# Patient Record
Sex: Male | Born: 1955 | Race: White | Hispanic: No | Marital: Married | Smoking: Never smoker
Health system: Southern US, Community
[De-identification: ages and names within clinical notes are randomized; demographics above are authoritative.]

## PROBLEM LIST (undated history)

## (undated) DIAGNOSIS — Z8249 Family history of ischemic heart disease and other diseases of the circulatory system: Secondary | ICD-10-CM

## (undated) DIAGNOSIS — I499 Cardiac arrhythmia, unspecified: Secondary | ICD-10-CM

## (undated) DIAGNOSIS — I10 Essential (primary) hypertension: Secondary | ICD-10-CM

## (undated) DIAGNOSIS — I480 Paroxysmal atrial fibrillation: Secondary | ICD-10-CM

## (undated) DIAGNOSIS — E785 Hyperlipidemia, unspecified: Secondary | ICD-10-CM

## (undated) DIAGNOSIS — R7303 Prediabetes: Secondary | ICD-10-CM

## (undated) HISTORY — DX: Cardiac arrhythmia, unspecified: I49.9

## (undated) HISTORY — DX: Prediabetes: R73.03

## (undated) HISTORY — PX: EYE SURGERY: SHX253

## (undated) HISTORY — DX: Essential (primary) hypertension: I10

## (undated) HISTORY — DX: Paroxysmal atrial fibrillation: I48.0

## (undated) HISTORY — DX: Family history of ischemic heart disease and other diseases of the circulatory system: Z82.49

## (undated) HISTORY — DX: Hyperlipidemia, unspecified: E78.5

---

## 2003-05-15 ENCOUNTER — Ambulatory Visit (HOSPITAL_COMMUNITY): Admission: RE | Admit: 2003-05-15 | Discharge: 2003-05-15 | Payer: Self-pay | Admitting: Oral Surgery

## 2003-05-15 ENCOUNTER — Encounter (INDEPENDENT_AMBULATORY_CARE_PROVIDER_SITE_OTHER): Payer: Self-pay | Admitting: Specialist

## 2009-10-14 ENCOUNTER — Encounter (INDEPENDENT_AMBULATORY_CARE_PROVIDER_SITE_OTHER): Payer: Self-pay | Admitting: *Deleted

## 2009-10-15 ENCOUNTER — Encounter (INDEPENDENT_AMBULATORY_CARE_PROVIDER_SITE_OTHER): Payer: Self-pay | Admitting: *Deleted

## 2009-10-16 ENCOUNTER — Ambulatory Visit: Payer: Self-pay | Admitting: Gastroenterology

## 2009-10-22 ENCOUNTER — Ambulatory Visit: Payer: Self-pay | Admitting: Gastroenterology

## 2010-08-27 NOTE — Miscellaneous (Signed)
Summary: screening colon age--ch.  Clinical Lists Changes  Medications: Added new medication of MOVIPREP 100 GM  SOLR (PEG-KCL-NACL-NASULF-NA ASC-C) As directed - Signed Rx of MOVIPREP 100 GM  SOLR (PEG-KCL-NACL-NASULF-NA ASC-C) As directed;  #1 x 0;  Signed;  Entered by: Clide Cliff RN;  Authorized by: Rachael Fee MD;  Method used: Electronically to [facility]  [address] [id number] [address], [place], [place]  [address], Ph: [phone] or [phone], Fax: [phone] Observations: Added new observation of NKA: T (10/16/2009 10:13)    Prescriptions: MOVIPREP 100 GM  SOLR (PEG-KCL-NACL-NASULF-NA ASC-C) As directed  #1 x 0   Entered by:   Clide Cliff RN   Authorized by:   Rachael Fee MD   Signed by:   Clide Cliff RN on 10/16/2009   Method used:   Electronically to        [facility]  [address] [id number] ([facility]       [address]       [place], [place]  [address]       Ph: [phone] or [phone]       Fax: [phone]   RxID:   [id number]

## 2010-08-27 NOTE — Letter (Signed)
Summary: [facility] Instructions  [facility]  [address] [place], [place] [address]   Phone: [phone]  Fax: [phone]       LORANCE PICKERAL    10-13-1955    MRN: [id number]        Procedure Day Dorna Bloom:  Wednesday 10/22/2009     Arrival Time: 8:00 am      Procedure Time: 9:00 am     Location of Procedure:                    _ x_   Endoscopy Center (4th Floor)                        PREPARATION FOR COLONOSCOPY WITH MOVIPREP   Starting 5 days prior to your procedure Friday 3/25 do not eat nuts, seeds, popcorn, corn, beans, peas,  salads, or any raw vegetables.  Do not take any fiber supplements (e.g. Metamucil, Citrucel, and Benefiber).  THE DAY BEFORE YOUR PROCEDURE         DATE: Tuesday 3/29  1.  Drink clear liquids the entire day-NO SOLID FOOD  2.  Do not drink anything colored red or purple.  Avoid juices with pulp.  No orange juice.  3.  Drink at least 64 oz. (8 glasses) of fluid/clear liquids during the day to prevent dehydration and help the prep work efficiently.  CLEAR LIQUIDS INCLUDE: Water Jello Ice Popsicles Tea (sugar ok, no milk/cream) Powdered fruit flavored drinks Coffee (sugar ok, no milk/cream) Gatorade Juice: apple, white grape, white cranberry  Lemonade Clear bullion, consomm, broth Carbonated beverages (any kind) Strained chicken noodle soup Hard Candy                             4.  In the morning, mix first dose of MoviPrep solution:    Empty 1 Pouch A and 1 Pouch B into the disposable container    Add lukewarm drinking water to the top line of the container. Mix to dissolve    Refrigerate (mixed solution should be used within 24 hrs)  5.  Begin drinking the prep at 5:00 p.m. The MoviPrep container is divided by 4 marks.   Every 15 minutes drink the solution down to the next mark (approximately 8 oz) until the full liter is complete.   6.  Follow completed prep with 16 oz of clear liquid of your choice (Nothing  red or purple).  Continue to drink clear liquids until bedtime.  7.  Before going to bed, mix second dose of MoviPrep solution:    Empty 1 Pouch A and 1 Pouch B into the disposable container    Add lukewarm drinking water to the top line of the container. Mix to dissolve    Refrigerate  THE DAY OF YOUR PROCEDURE      DATE: Wednesday 3/30  Beginning at 4:00 a.m. (5 hours before procedure):         1. Every 15 minutes, drink the solution down to the next mark (approx 8 oz) until the full liter is complete.  2. Follow completed prep with 16 oz. of clear liquid of your choice.    3. You may drink clear liquids until 7:00 am (2 HOURS BEFORE PROCEDURE).   MEDICATION INSTRUCTIONS  Unless otherwise instructed, you should take regular prescription medications with a small sip of water   as early as possible the morning of  your procedure.         OTHER INSTRUCTIONS  You will need a responsible adult at least 57 years of age to accompany you and drive you home.   This person must remain in the waiting room during your procedure.  Wear loose fitting clothing that is easily removed.  Leave jewelry and other valuables at home.  However, you may wish to bring a book to read or  an iPod/MP3 player to listen to music as you wait for your procedure to start.  Remove all body piercing jewelry and leave at home.  Total time from sign-in until discharge is approximately 2-3 hours.  You should go home directly after your procedure and rest.  You can resume normal activities the  day after your procedure.  The day of your procedure you should not:   Drive   Make legal decisions   Operate machinery   Drink alcohol   Return to work  You will receive specific instructions about eating, activities and medications before you leave.    The above instructions have been reviewed and explained to me by   Clide Cliff, RN_____________________    I fully understand and can  verbalize these instructions _____________________________ Date _________

## 2010-08-27 NOTE — Procedures (Signed)
Summary: Colonoscopy  Patient: [name] Note: All result statuses are Final unless otherwise noted.  Tests: (1) Colonoscopy (COL)   COL Colonoscopy           DONE     [facility]     [address]     [place], [place]  [address]           COLONOSCOPY PROCEDURE REPORT           PATIENT:  Frank Hunt, Frank Hunt  MR#:  [id number]     BIRTHDATE:  [birth year], 54 yrs. old  GENDER:  male     ENDOSCOPIST:  Rachael Fee, MD     REF. BY:  [name], M.D.     PROCEDURE DATE:  10/22/2009     PROCEDURE:  Diagnostic Colonoscopy     ASA CLASS:  Class II     INDICATIONS:  Routine Risk Screening     MEDICATIONS:   Fentanyl 100 mcg IV, Versed 10 mg IV, Benadryl 12.5     mg IV           DESCRIPTION OF PROCEDURE:   After the risks benefits and     alternatives of the procedure were thoroughly explained, informed     consent was obtained.  Digital rectal exam was performed and     revealed no rectal masses.   The LB CF-[id number] endoscope     was introduced through the anus and advanced to the cecum, which     was identified by both the appendix and ileocecal valve, without     limitations.  The quality of the prep was good, using MoviPrep.     The instrument was then slowly withdrawn as the colon was fully     examined.     <<PROCEDUREIMAGES>>           FINDINGS:  A normal appearing cecum, ileocecal valve, and     appendiceal orifice were identified. The ascending, hepatic     flexure, transverse, splenic flexure, descending, sigmoid colon,     and rectum appeared unremarkable (see image1, image2, and image3).     Retroflexed views in the rectum revealed no abnormalities.    The     scope was then withdrawn from the patient and the procedure     completed.           COMPLICATIONS:  None     ENDOSCOPIC IMPRESSION:     1) Normal colon     2) No polyps or cancers           RECOMMENDATIONS:     1) Continue current colorectal screening recommendations for     "routine  risk" patients with a repeat colonoscopy in 10 years.           REPEAT EXAM:  10 years           ______________________________     Rachael Fee, MD           n.     eSIGNED:   Rachael Fee at 10/22/2009 09:28 AM           Silvestre Mesi, [id number]  Note: An exclamation mark (!) indicates a result that was not dispersed into the flowsheet. Document Creation Date: 10/22/2009 9:29 AM _______________________________________________________________________  (1) Order result status: Final Collection or observation date-time: 10/22/2009 09:25 Requested date-time:  Receipt date-time:  Reported date-time:  Referring Physician:   Ordering Physician: Rob Bunting [phone]) Specimen Source:  Source:  Launa Grill Order Number: [id number] Lab site:   Appended Document: Colonoscopy    Clinical Lists Changes  Observations: Added new observation of COLONNXTDUE: 09/2019 (10/22/2009 11:32)

## 2010-08-27 NOTE — Letter (Signed)
Summary: Previsit letter  [facility]  [address] [place], [place] [address]   Phone: [phone]  Fax: [phone]       10/14/2009 MRN: [id number]  [facility] [address] [place], [place]  [address]  Dear Mr. Frank Hunt,  Welcome to the Gastroenterology Division at [facility].    You are scheduled to see a nurse for your pre-procedure visit on 10-16-09 at 10:30a.m. on the 3rd floor at [facility], [address]  We ask that you try to arrive at our office 15 minutes prior to your appointment time to allow for check-in.  Your nurse visit will consist of discussing your medical and surgical history, your immediate family medical history, and your medications.    Please bring a complete list of all your medications or, if you prefer, bring the medication bottles and we will list them.  We will need to be aware of both prescribed and over the counter drugs.  We will need to know exact dosage information as well.  If you are on blood thinners (Coumadin, Plavix, Aggrenox, Ticlid, etc.) please call our office today/prior to your appointment, as we need to consult with your physician about holding your medication.   Please be prepared to read and sign documents such as consent forms, a financial agreement, and acknowledgement forms.  If necessary, and with your consent, a friend or relative is welcome to sit-in on the nurse visit with you.  Please bring your insurance card so that we may make a copy of it.  If your insurance requires a referral to see a specialist, please bring your referral form from your primary care physician.  No co-pay is required for this nurse visit.     If you cannot keep your appointment, please call [phone] to cancel or reschedule prior to your appointment date.  This allows [place] the opportunity to schedule an appointment for another patient in need of care.    Thank you for choosing [facility] for your medical needs.   We appreciate the opportunity to care for you.  Please visit [place] at our website  to learn more about our practice.                     Sincerely.                                                                                                                   The Gastroenterology Division

## 2010-12-11 NOTE — Op Note (Signed)
Frank Hunt, Frank Hunt                           ACCOUNT NO.:  [id number]   MEDICAL RECORD NO.:  [id number]                   PATIENT TYPE:  OIB   LOCATION:  2889                                 FACILITY:  [facility]   PHYSICIAN:  Hewitt Blade, D.D.S.             DATE OF BIRTH:  11-Jan-1956   DATE OF PROCEDURE:  05/15/2003  DATE OF DISCHARGE:  05/15/2003                                 OPERATIVE REPORT   PREOPERATIVE DIAGNOSES:  1. 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  2. Impacted third molar teeth numbers 1, 16, 17, and 32.   POSTOPERATIVE DIAGNOSES:  1. 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  2. Impacted third molar teeth numbers 1, 16, 17, and 32.   OPERATION PERFORMED:  1. Removal of the intrabony cyst, left posterior mandible.  2. Removal of the impacted third molar teeth.   SURGEON:  Hewitt Blade, D.D.S.   ASSISTANT:  Earlene Plater.   ANESTHESIA:  General via nasal endotracheal intubation.   ESTIMATED BLOOD LOSS:  Less than 50mL.   FLUIDS REPLACED:  Approximately crystalloid solutions.   COMPLICATIONS:  None apparent.   INDICATIONS FOR PROCEDURE:  Frank Hunt is a 55 year old gentleman who is  referred to my office for removal of the cyst in his left mandible and the  impacted third molar teeth.  Due to the size of the cyst, and the  displacement of the left mandibular third molar tooth #17 inferiorly and its  involvement with the neurovascular bundle, it was recommended that the  procedure be performed  under operating room conditions.   DESCRIPTION OF PROCEDURE:  On May 15, 2003, Frank Hunt was taken to [facility] operating suite where he was placed on the operating room  table in a supine position.  Following successful nasal endotracheal  intubation and general anesthesia, the patient's face, neck and oral cavity  were prepped and draped in the usual sterile operating room fashion.  The  hypopharynx was suctioned free of  fluids and secretions and a moistened two-  inch vaginal pack was placed as a throat pack.  Attention was then directed  intraorally, where approximately 12mL of 0.5% Xylocaine containing 1:200,000  epinephrine were infiltrated in the right and left maxillary and posterior,  superior alveolar nerve distributions, the corresponding palatal soft  tissues, and the right and left inferior alveolar neurovascular regions.  Attention was then directed towards the right posterior maxillary arch,  where a full thickness flap was elevated around tooth #1.  The tooth was  subluxated from the alveolus using a straight elevator and removed from the  oral cavity using a 150 dental forcep.  The bony margins were then smoothed  and contoured with a small osseous file and the area thoroughly irrigated  with sterile saline irrigating solutions and suctioned.  The mucoperiosteal  margins were then approximated and sutured in an interrupted fashion using  4-  0 chromic suture material.  In a similar fashion, tooth #16 was removed as  well.  Attention was then directed towards the left posterior mandible where  a 2.5 curvilinear incision was made distal to tooth #18.  A full thickness  mucoperiosteal flap was then elevated and positioned laterally and  inferiorly approximately 2 cm exposing the superior and lateral aspect of  the posterior mandible.  Stryker rotary osteotome with a #8 round bur and  copious irrigation was used to create an interosseous window approximately  1.5 cm in diameter posterior to tooth #18.  A large cystic cavity was  entered and found to be filled with a cheesy appearing material.  The  margins of the cyst were then gently curetted from the mandible.  Tooth #17  was then visualized. This tooth was then sectioned in its long and vertical  axes using a Stryker rotary osteotome and a 701 fissure bur.  The tooth was  then subluxated and removed from the mandible using a straight elevator  and  mosquito hemostats.  The remaining cyst was then gently teased from the  margins of the cavity of the mandible and submitted for histologic  examination.  The bony margins were then smoothed and contoured with a small  osseous file.  The cystic cavity was then thoroughly irrigated with sterile  saline irrigating solution and suctioned.  No further remnants of the cyst  could be found.  The mucoperiosteal margins were then sutured in a  watertight fashion using 4-0 chromic suture material on an FS2 needle.  Attention was then directed towards the right mandible where a 1.5  curvilinear incision was made posterior to tooth #31.  The full thickness  flap was elevated laterally and inferiorly using a #9 Mold periosteal  elevator exposing the cortical bone of the mandible.  The overlying cortical  bone of tooth #32 was then reduced using a Stryker rotary osteotome and a #8  round bur.  The tooth was then sectioned in its long and vertical axes using  a Stryker rotary osteotome, and a 701 fissure bur.  The tooth was then  subluxated from the alveolus and removed using mosquito hemostats.  The  surrounding dental follicular tissue was curetted and removed using a double  ended Molt curet.  Bony margins were smoothed with a small osseous file and  the surgical site thoroughly irrigated with sterile saline irrigating  solutions and suctioned.  Mucoperiosteal margins were then approximated and  sutured in an interrupted fashion using 4-0 chromic suture material.  The  oral cavity was then thoroughly irrigated and suctioned.  The throat pack  was removed, and the hypopharynx suctioned free of fluids and secretions.  The patient was then allowed to awaken from the anesthesia and taken to the  recovery room where he tolerated the procedure well and without apparent  complication.                                               Hewitt Blade, D.D.S.   DC/MEDQ  D:  05/28/2003  T:  05/28/2003   Job:  [id number]

## 2013-03-26 HISTORY — PX: CATARACT EXTRACTION W/ INTRAOCULAR LENS IMPLANT: SHX1309

## 2013-05-04 ENCOUNTER — Encounter: Payer: Self-pay | Admitting: Physician Assistant

## 2013-07-03 ENCOUNTER — Other Ambulatory Visit: Payer: Self-pay | Admitting: Internal Medicine

## 2013-07-03 ENCOUNTER — Encounter: Payer: Self-pay | Admitting: Internal Medicine

## 2013-07-03 DIAGNOSIS — F988 Other specified behavioral and emotional disorders with onset usually occurring in childhood and adolescence: Secondary | ICD-10-CM

## 2013-07-03 MED ORDER — AMPHETAMINE-DEXTROAMPHETAMINE 20 MG PO TABS: ORAL_TABLET | ORAL | Status: DC

## 2013-08-31 ENCOUNTER — Encounter: Payer: Self-pay | Admitting: Internal Medicine

## 2013-08-31 ENCOUNTER — Ambulatory Visit (INDEPENDENT_AMBULATORY_CARE_PROVIDER_SITE_OTHER): Payer: BC Managed Care – PPO | Admitting: Internal Medicine

## 2013-08-31 DIAGNOSIS — B078 Other viral warts: Secondary | ICD-10-CM

## 2013-08-31 DIAGNOSIS — B079 Viral wart, unspecified: Secondary | ICD-10-CM

## 2013-08-31 NOTE — Progress Notes (Signed)
Patient ID: Frank Hunt, male   DOB: Dec 05, 1955, 58 y.o.   MRN: [id number]   Patient presents for evaluation of    Exam: 2 filamentous warts - one is 8 x 12 mm mid dorsal Lt hand and 2sd id similar size of mid medial Rt thigh  The risks, benefits, indications, potential complications, and alternatives were explained to the patient.  Procedure Details  After informed consent and local anesthesia with xylocaine   marcaine with epi 1%, the areas were prepped by sterile/aseptic technique with isopropyl alcohol.  Both lesions were excised by shave technique with  #10 blades. Hyfrecation was deeply applied for hemostasis and electro destruction of an possible remnant wart tissue   Antibiotic ointment and then  a sterile  2" x 3 " tegoderm was applied to both surgical sites. The patient tolerated the procedure well with minimal blood loss.   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 Patient was instructed to keep the wound dry and covered for 24-48 hours and clean thereafter. 2. Warning signs of infection were reviewed & pt advised to call if any questions/problems.    3. Recommended that the patient use minor analgesics as needed for pain.   4. Return as needed.

## 2013-09-03 ENCOUNTER — Other Ambulatory Visit: Payer: Self-pay | Admitting: Emergency Medicine

## 2013-09-03 DIAGNOSIS — F988 Other specified behavioral and emotional disorders with onset usually occurring in childhood and adolescence: Secondary | ICD-10-CM

## 2013-09-03 MED ORDER — AMPHETAMINE-DEXTROAMPHETAMINE 20 MG PO TABS: ORAL_TABLET | ORAL | Status: DC

## 2013-11-09 ENCOUNTER — Other Ambulatory Visit: Payer: Self-pay | Admitting: Internal Medicine

## 2013-11-09 DIAGNOSIS — E291 Testicular hypofunction: Secondary | ICD-10-CM

## 2013-11-09 DIAGNOSIS — F988 Other specified behavioral and emotional disorders with onset usually occurring in childhood and adolescence: Secondary | ICD-10-CM

## 2013-11-09 MED ORDER — TESTOSTERONE CYPIONATE 200 MG/ML IM SOLN: INTRAMUSCULAR | Status: DC

## 2013-11-09 MED ORDER — AMPHETAMINE-DEXTROAMPHETAMINE 20 MG PO TABS: ORAL_TABLET | ORAL | Status: DC

## 2013-12-19 ENCOUNTER — Other Ambulatory Visit: Payer: Self-pay | Admitting: Internal Medicine

## 2013-12-19 DIAGNOSIS — F988 Other specified behavioral and emotional disorders with onset usually occurring in childhood and adolescence: Secondary | ICD-10-CM

## 2013-12-19 MED ORDER — AMPHETAMINE-DEXTROAMPHETAMINE 20 MG PO TABS: ORAL_TABLET | ORAL | Status: DC

## 2013-12-24 ENCOUNTER — Other Ambulatory Visit: Payer: Self-pay | Admitting: Internal Medicine

## 2013-12-24 MED ORDER — PREDNISONE 20 MG PO TABS: 20.0000 mg | ORAL_TABLET | ORAL | Status: DC

## 2014-03-13 ENCOUNTER — Ambulatory Visit (INDEPENDENT_AMBULATORY_CARE_PROVIDER_SITE_OTHER): Payer: BC Managed Care – PPO | Admitting: Internal Medicine

## 2014-03-13 ENCOUNTER — Encounter: Payer: Self-pay | Admitting: Internal Medicine

## 2014-03-13 VITALS — BP 118/76 | HR 72 | Temp 97.6°F | Resp 16 | Ht 71.0 in | Wt 165.6 lb

## 2014-03-13 DIAGNOSIS — R740 Nonspecific elevation of levels of transaminase and lactic acid dehydrogenase [LDH]: Secondary | ICD-10-CM

## 2014-03-13 DIAGNOSIS — Z1212 Encounter for screening for malignant neoplasm of rectum: Secondary | ICD-10-CM

## 2014-03-13 DIAGNOSIS — Z125 Encounter for screening for malignant neoplasm of prostate: Secondary | ICD-10-CM

## 2014-03-13 DIAGNOSIS — R030 Elevated blood-pressure reading, without diagnosis of hypertension: Secondary | ICD-10-CM

## 2014-03-13 DIAGNOSIS — R7402 Elevation of levels of lactic acid dehydrogenase (LDH): Secondary | ICD-10-CM

## 2014-03-13 DIAGNOSIS — E559 Vitamin D deficiency, unspecified: Secondary | ICD-10-CM

## 2014-03-13 DIAGNOSIS — Z113 Encounter for screening for infections with a predominantly sexual mode of transmission: Secondary | ICD-10-CM

## 2014-03-13 DIAGNOSIS — Z111 Encounter for screening for respiratory tuberculosis: Secondary | ICD-10-CM

## 2014-03-13 DIAGNOSIS — F988 Other specified behavioral and emotional disorders with onset usually occurring in childhood and adolescence: Secondary | ICD-10-CM

## 2014-03-13 DIAGNOSIS — R7401 Elevation of levels of liver transaminase levels: Secondary | ICD-10-CM

## 2014-03-13 DIAGNOSIS — Z79899 Other long term (current) drug therapy: Secondary | ICD-10-CM

## 2014-03-13 DIAGNOSIS — Z0000 Encounter for general adult medical examination without abnormal findings: Secondary | ICD-10-CM

## 2014-03-13 LAB — HEPATIC FUNCTION PANEL
ALBUMIN: 4 g/dL (ref 3.5–5.2)
ALT: 19 U/L (ref 0–53)
AST: 21 U/L (ref 0–37)
Alkaline Phosphatase: 54 U/L (ref 39–117)
BILIRUBIN DIRECT: 0.1 mg/dL (ref 0.0–0.3)
BILIRUBIN INDIRECT: 0.7 mg/dL (ref 0.2–1.2)
Total Bilirubin: 0.8 mg/dL (ref 0.2–1.2)
Total Protein: 6.2 g/dL (ref 6.0–8.3)

## 2014-03-13 LAB — BASIC METABOLIC PANEL WITH GFR
BUN: 15 mg/dL (ref 6–23)
CALCIUM: 8.3 mg/dL — AB (ref 8.4–10.5)
CHLORIDE: 104 meq/L (ref 96–112)
CO2: 28 mEq/L (ref 19–32)
Creat: 0.76 mg/dL (ref 0.50–1.35)
GFR, Est Non African American: >89 mL/min
GLUCOSE: 93 mg/dL (ref 70–99)
Potassium: 4 mEq/L (ref 3.5–5.3)
Sodium: 139 mEq/L (ref 135–145)

## 2014-03-13 LAB — CBC WITH DIFFERENTIAL/PLATELET
BASOS ABS: 0 10*3/uL (ref 0.0–0.1)
BASOS PCT: 1 % (ref 0–1)
EOS PCT: 6 % — AB (ref 0–5)
Eosinophils Absolute: 0.3 10*3/uL (ref 0.0–0.7)
HCT: 41.9 % (ref 39.0–52.0)
Hemoglobin: 14.1 g/dL (ref 13.0–17.0)
Lymphocytes Relative: 32 % (ref 12–46)
Lymphs Abs: 1.4 10*3/uL (ref 0.7–4.0)
MCH: 29.6 pg (ref 26.0–34.0)
MCHC: 33.7 g/dL (ref 30.0–36.0)
MCV: 87.8 fL (ref 78.0–100.0)
MONOS PCT: 8 % (ref 3–12)
Monocytes Absolute: 0.3 10*3/uL (ref 0.1–1.0)
NEUTROS ABS: 2.3 10*3/uL (ref 1.7–7.7)
NEUTROS PCT: 53 % (ref 43–77)
PLATELETS: 191 10*3/uL (ref 150–400)
RBC: 4.77 MIL/uL (ref 4.22–5.81)
RDW: 13.2 % (ref 11.5–15.5)
WBC: 4.3 10*3/uL (ref 4.0–10.5)

## 2014-03-13 LAB — LIPID PANEL
Cholesterol: 180 mg/dL (ref 0–200)
HDL: 72 mg/dL (ref >39)
LDL Cholesterol: 90 mg/dL (ref 0–99)
Total CHOL/HDL Ratio: 2.5 Ratio
Triglycerides: 91 mg/dL (ref <150)
VLDL: 18 mg/dL (ref 0–40)

## 2014-03-13 LAB — VITAMIN B12: Vitamin B-12: 616 pg/mL (ref 211–911)

## 2014-03-13 LAB — TSH: TSH: 2.106 u[IU]/mL (ref 0.350–4.500)

## 2014-03-13 LAB — MAGNESIUM: Magnesium: 1.7 mg/dL (ref 1.5–2.5)

## 2014-03-13 MED ORDER — AMPHETAMINE-DEXTROAMPHETAMINE 20 MG PO TABS: ORAL_TABLET | ORAL | Status: DC

## 2014-03-13 NOTE — Patient Instructions (Signed)
Recommend the book "The END of DIETING" by Dr Baker Janus   and the book "The END of DIABETES " by Dr Excell Seltzer  At [email or web address] - get book & Audio CD's      Being diabetic has a  300% increased risk for heart attack, stroke, cancer, and alzheimer- type vascular dementia. It is very important that you work harder with diet by avoiding all foods that are white except chicken & fish. Avoid white rice (brown & wild rice is OK), white potatoes (sweetpotatoes in moderation is OK), White bread or wheat bread or anything made out of white flour like bagels, donuts, rolls, buns, biscuits, cakes, pastries, cookies, pizza crust, and pasta (made from white flour & egg whites) - vegetarian pasta or spinach or wheat pasta is OK. Multigrain breads like Arnold's or [employer], or multigrain sandwich thins or flatbreads.  Diet, exercise and weight loss can reverse and cure diabetes in the early stages.  Diet, exercise and weight loss is very important in the control and prevention of complications of diabetes which affects every system in your body, ie. Brain - dementia/stroke, eyes - glaucoma/blindness, heart - heart attack/heart failure, kidneys - dialysis, stomach - gastric paralysis, intestines - malabsorption, nerves - severe painful neuritis, circulation - gangrene & loss of a leg(s), and finally cancer and Alzheimers.    I recommend avoid fried & greasy foods,  sweets/candy, white rice (brown or wild rice or Quinoa is OK), white potatoes (sweet potatoes are OK) - anything made from white flour - bagels, doughnuts, rolls, buns, biscuits,white and wheat breads, pizza crust and traditional pasta made of white flour & egg white(vegetarian pasta or spinach or wheat pasta is OK).  Multi-grain bread is OK - like multi-grain flat bread or sandwich thins. Avoid alcohol in excess. Exercise is also important.    Eat all the vegetables you want - avoid meat, especially red meat and dairy - especially cheese.  Cheese  is the most concentrated form of trans-fats which is the worst thing to clog up our arteries. Veggie cheese is OK which can be found in the fresh produce section at [employer] or [employer] or [employer]  Preventive Care for Adults A healthy lifestyle and preventive care can promote health and wellness. Preventive health guidelines for men include the following key practices:  A routine yearly physical is a good way to check with your health care provider about your health and preventative screening. It is a chance to share any concerns and updates on your health and to receive a thorough exam.  Visit your dentist for a routine exam and preventative care every 6 months. Brush your teeth twice a day and floss once a day. Good oral hygiene prevents tooth decay and gum disease.  The frequency of eye exams is based on your age, health, family medical history, use of contact lenses, and other factors. Follow your health care provider's recommendations for frequency of eye exams.  Eat a healthy diet. Foods such as vegetables, fruits, whole grains, low-fat dairy products, and lean protein foods contain the nutrients you need without too many calories. Decrease your intake of foods high in solid fats, added sugars, and salt. Eat the right amount of calories for you.Get information about a proper diet from your health care provider, if necessary.  Regular physical exercise is one of the most important things you can do for your health. Most adults should get at least 150 minutes of moderate-intensity exercise (any activity that  increases your heart rate and causes you to sweat) each week. In addition, most adults need muscle-strengthening exercises on 2 or more days a week.  Maintain a healthy weight. The body mass index (BMI) is a screening tool to identify possible weight problems. It provides an estimate of body fat based on height and weight. Your health care provider can find your BMI and can help you  achieve or maintain a healthy weight.For adults 20 years and older:  A BMI below 18.5 is considered underweight.  A BMI of 18.5 to 24.9 is normal.  A BMI of 25 to 29.9 is considered overweight.  A BMI of 30 and above is considered obese.  Maintain normal blood lipids and cholesterol levels by exercising and minimizing your intake of saturated fat. Eat a balanced diet with plenty of fruit and vegetables. Blood tests for lipids and cholesterol should begin at age 20 and be repeated every 5 years. If your lipid or cholesterol levels are high, you are over 50, or you are at high risk for heart disease, you may need your cholesterol levels checked more frequently.Ongoing high lipid and cholesterol levels should be treated with medicines if diet and exercise are not working.  If you smoke, find out from your health care provider how to quit. If you do not use tobacco, do not start.  Lung cancer screening is recommended for adults aged 72-80 years who are at high risk for developing lung cancer because of a history of smoking. A yearly low-dose CT scan of the lungs is recommended for people who have at least a 30-pack-year history of smoking and are a current smoker or have quit within the past 15 years. A pack year of smoking is smoking an average of 1 pack of cigarettes a day for 1 year (for example: 1 pack a day for 30 years or 2 packs a day for 15 years). Yearly screening should continue until the smoker has stopped smoking for at least 15 years. Yearly screening should be stopped for people who develop a health problem that would prevent them from having lung cancer treatment.  If you choose to drink alcohol, do not have more than 2 drinks per day. One drink is considered to be 12 ounces (355 mL) of beer, 5 ounces (148 mL) of wine, or 1.5 ounces (44 mL) of liquor.  Avoid use of street drugs. Do not share needles with anyone. Ask for help if you need support or instructions about stopping the use of  drugs.  High blood pressure causes heart disease and increases the risk of stroke. Your blood pressure should be checked at least every 1-2 years. Ongoing high blood pressure should be treated with medicines, if weight loss and exercise are not effective.  If you are 28-64 years old, ask your health care provider if you should take aspirin to prevent heart disease.  Diabetes screening involves taking a blood sample to check your fasting blood sugar level. This should be done once every 3 years, after age 13, if you are within normal weight and without risk factors for diabetes. Testing should be considered at a younger age or be carried out more frequently if you are overweight and have at least 1 risk factor for diabetes.  Colorectal cancer can be detected and often prevented. Most routine colorectal cancer screening begins at the age of 78 and continues through age 56. However, your health care provider may recommend screening at an earlier age if you have risk  factors for colon cancer. On a yearly basis, your health care provider may provide home test kits to check for hidden blood in the stool. Use of a small camera at the end of a tube to directly examine the colon (sigmoidoscopy or colonoscopy) can detect the earliest forms of colorectal cancer. Talk to your health care provider about this at age 48, when routine screening begins. Direct exam of the colon should be repeated every 5-10 years through age 60, unless early forms of precancerous polyps or small growths are found.  People who are at an increased risk for hepatitis B should be screened for this virus. You are considered at high risk for hepatitis B if:  You were born in a country where hepatitis B occurs often. Talk with your health care provider about which countries are considered high risk.  Your parents were born in a high-risk country and you have not received a shot to protect against hepatitis B (hepatitis B vaccine).  You have  HIV or AIDS.  You use needles to inject street drugs.  You live with, or have sex with, someone who has hepatitis B.  You are a man who has sex with other men (MSM).  You get hemodialysis treatment.  You take certain medicines for conditions such as cancer, organ transplantation, and autoimmune conditions.  Hepatitis C blood testing is recommended for all people born from [birth year] through 1965 and any individual with known risks for hepatitis C.  Practice safe sex. Use condoms and avoid high-risk sexual practices to reduce the spread of sexually transmitted infections (STIs). STIs include gonorrhea, chlamydia, syphilis, trichomonas, herpes, HPV, and human immunodeficiency virus (HIV). Herpes, HIV, and HPV are viral illnesses that have no cure. They can result in disability, cancer, and death.  If you are at risk of being infected with HIV, it is recommended that you take a prescription medicine daily to prevent HIV infection. This is called preexposure prophylaxis (PrEP). You are considered at risk if:  You are a man who has sex with other men (MSM) and have other risk factors.  You are a heterosexual man, are sexually active, and are at increased risk for HIV infection.  You take drugs by injection.  You are sexually active with a partner who has HIV.  Talk with your health care provider about whether you are at high risk of being infected with HIV. If you choose to begin PrEP, you should first be tested for HIV. You should then be tested every 3 months for as long as you are taking PrEP.  A one-time screening for abdominal aortic aneurysm (AAA) and surgical repair of large AAAs by ultrasound are recommended for men ages 51 to 11 years who are current or former smokers.  Healthy men should no longer receive prostate-specific antigen (PSA) blood tests as part of routine cancer screening. Talk with your health care provider about prostate cancer screening.  Testicular cancer screening is  not recommended for adult males who have no symptoms. Screening includes self-exam, a health care provider exam, and other screening tests. Consult with your health care provider about any symptoms you have or any concerns you have about testicular cancer.  Use sunscreen. Apply sunscreen liberally and repeatedly throughout the day. You should seek shade when your shadow is shorter than you. Protect yourself by wearing long sleeves, pants, a wide-brimmed hat, and sunglasses year round, whenever you are outdoors.  Once a month, do a whole-body skin exam, using a mirror to look  at the skin on your back. Tell your health care provider about new moles, moles that have irregular borders, moles that are larger than a pencil eraser, or moles that have changed in shape or color.  Stay current with required vaccines (immunizations).  Influenza vaccine. All adults should be immunized every year.  Tetanus, diphtheria, and acellular pertussis (Td, Tdap) vaccine. An adult who has not previously received Tdap or who does not know his vaccine status should receive 1 dose of Tdap. This initial dose should be followed by tetanus and diphtheria toxoids (Td) booster doses every 10 years. Adults with an unknown or incomplete history of completing a 3-dose immunization series with Td-containing vaccines should begin or complete a primary immunization series including a Tdap dose. Adults should receive a Td booster every 10 years.  Varicella vaccine. An adult without evidence of immunity to varicella should receive 2 doses or a second dose if he has previously received 1 dose.  Human papillomavirus (HPV) vaccine. Males aged 29-21 years who have not received the vaccine previously should receive the 3-dose series. Males aged 22-26 years may be immunized. Immunization is recommended through the age of 26 years for any male who has sex with males and did not get any or all doses earlier. Immunization is recommended for any  person with an immunocompromised condition through the age of 29 years if he did not get any or all doses earlier. During the 3-dose series, the second dose should be obtained 4-8 weeks after the first dose. The third dose should be obtained 24 weeks after the first dose and 16 weeks after the second dose.  Zoster vaccine. One dose is recommended for adults aged 38 years or older unless certain conditions are present.  Measles, mumps, and rubella (MMR) vaccine. Adults born before [birth year] generally are considered immune to measles and mumps. Adults born in [birth year] or later should have 1 or more doses of MMR vaccine unless there is a contraindication to the vaccine or there is laboratory evidence of immunity to each of the three diseases. A routine second dose of MMR vaccine should be obtained at least 28 days after the first dose for students attending postsecondary schools, health care workers, or international travelers. People who received inactivated measles vaccine or an unknown type of measles vaccine during 1963-1967 should receive 2 doses of MMR vaccine. People who received inactivated mumps vaccine or an unknown type of mumps vaccine before 1979 and are at high risk for mumps infection should consider immunization with 2 doses of MMR vaccine. Unvaccinated health care workers born before [birth year] who lack laboratory evidence of measles, mumps, or rubella immunity or laboratory confirmation of disease should consider measles and mumps immunization with 2 doses of MMR vaccine or rubella immunization with 1 dose of MMR vaccine.  Pneumococcal 13-valent conjugate (PCV13) vaccine. When indicated, a person who is uncertain of his immunization history and has no record of immunization should receive the PCV13 vaccine. An adult aged 68 years or older who has certain medical conditions and has not been previously immunized should receive 1 dose of PCV13 vaccine. This PCV13 should be followed with a dose of pneumococcal  polysaccharide (PPSV23) vaccine. The PPSV23 vaccine dose should be obtained at least 8 weeks after the dose of PCV13 vaccine. An adult aged 95 years or older who has certain medical conditions and previously received 1 or more doses of PPSV23 vaccine should receive 1 dose of PCV13. The PCV13 vaccine dose should be obtained 1  or more years after the last PPSV23 vaccine dose.  Pneumococcal polysaccharide (PPSV23) vaccine. When PCV13 is also indicated, PCV13 should be obtained first. All adults aged 98 years and older should be immunized. An adult younger than age 49 years who has certain medical conditions should be immunized. Any person who resides in a nursing home or long-term care facility should be immunized. An adult smoker should be immunized. People with an immunocompromised condition and certain other conditions should receive both PCV13 and PPSV23 vaccines. People with human immunodeficiency virus (HIV) infection should be immunized as soon as possible after diagnosis. Immunization during chemotherapy or radiation therapy should be avoided. Routine use of PPSV23 vaccine is not recommended for American Indians, [place] Natives, or people younger than 65 years unless there are medical conditions that require PPSV23 vaccine. When indicated, people who have unknown immunization and have no record of immunization should receive PPSV23 vaccine. One-time revaccination 5 years after the first dose of PPSV23 is recommended for people aged 19-64 years who have chronic kidney failure, nephrotic syndrome, asplenia, or immunocompromised conditions. People who received 1-2 doses of PPSV23 before age 94 years should receive another dose of PPSV23 vaccine at age 31 years or later if at least 5 years have passed since the previous dose. Doses of PPSV23 are not needed for people immunized with PPSV23 at or after age 37 years.  Meningococcal vaccine. Adults with asplenia or persistent complement component deficiencies  should receive 2 doses of quadrivalent meningococcal conjugate (MenACWY-D) vaccine. The doses should be obtained at least 2 months apart. Microbiologists working with certain meningococcal bacteria, [employer] recruits, people at risk during an outbreak, and people who travel to or live in countries with a high rate of meningitis should be immunized. A first-year college student up through age 33 years who is living in a residence hall should receive a dose if he did not receive a dose on or after his 16th birthday. Adults who have certain high-risk conditions should receive one or more doses of vaccine.  Hepatitis A vaccine. Adults who wish to be protected from this disease, have certain high-risk conditions, work with hepatitis A-infected animals, work in hepatitis A research labs, or travel to or work in countries with a high rate of hepatitis A should be immunized. Adults who were previously unvaccinated and who anticipate close contact with an international adoptee during the first 60 days after arrival in the [place] from a country with a high rate of hepatitis A should be immunized.  Hepatitis B vaccine. Adults should be immunized if they wish to be protected from this disease, have certain high-risk conditions, may be exposed to blood or other infectious body fluids, are household contacts or sex partners of hepatitis B positive people, are clients or workers in certain care facilities, or travel to or work in countries with a high rate of hepatitis B.  Haemophilus influenzae type b (Hib) vaccine. A previously unvaccinated person with asplenia or sickle cell disease or having a scheduled splenectomy should receive 1 dose of Hib vaccine. Regardless of previous immunization, a recipient of a hematopoietic stem cell transplant should receive a 3-dose series 6-12 months after his successful transplant. Hib vaccine is not recommended for adults with HIV infection. Preventive Service /  Frequency  Blood pressure check.** / Every 1 to 2 years.  Lipid and cholesterol check.** / Every 5 years beginning at age 55.  Lung cancer screening. / Every year if you are aged 40-80 years and have a  30-pack-year history of smoking and currently smoke or have quit within the past 15 years. Yearly screening is stopped once you have quit smoking for at least 15 years or develop a health problem that would prevent you from having lung cancer treatment.  Fecal occult blood test (FOBT) of stool. / Every year beginning at age 49 and continuing until age 46. You may not have to do this test if you get a colonoscopy every 10 years.  Flexible sigmoidoscopy** or colonoscopy.** / Every 5 years for a flexible sigmoidoscopy or every 10 years for a colonoscopy beginning at age 34 and continuing until age 44.  Hepatitis C blood test.** / For all people born from [birth year] through 1967 and any individual with known risks for hepatitis C.  Skin self-exam. / Monthly.  Influenza vaccine. / Every year.  Tetanus, diphtheria, and acellular pertussis (Tdap/Td) vaccine.** / Consult your health care provider. 1 dose of Td every 10 years.  Varicella vaccine.** / Consult your health care provider.  Zoster vaccine.** / 1 dose for adults aged 103 years or older.  Measles, mumps, rubella (MMR) vaccine.** / You need at least 1 dose of MMR if you were born in 1957 or later. You may also need a second dose.  Pneumococcal 13-valent conjugate (PCV13) vaccine.** / Consult your health care provider.  Pneumococcal polysaccharide (PPSV23) vaccine.** / 1 to 2 doses if you smoke cigarettes or if you have certain conditions.  Meningococcal vaccine.** / Consult your health care provider.  Hepatitis A vaccine.** / Consult your health care provider.  Hepatitis B vaccine.** / Consult your health care provider.  Haemophilus influenzae type b (Hib) vaccine.** / Consult your health care provider.

## 2014-03-13 NOTE — Progress Notes (Signed)
Patient ID: Frank Hunt, male   DOB: November 11, 1955, 58 y.o.   MRN: [id number]   Annual Screening Comprehensive Examination   This very nice 58 y.o.MWM presents for complete physical.  Patient has no major health issues. He is on Testosterone replacement therapy on a monthly schedule with improved sense of well being, but feeling that it "wears off " after about 2 weeks. He also is on Adderall for Add with improved focusing and concentration.   Lipid profile in Dec 2013 found Nl Chol 172, TG 160, HDL 50 & LDL 85. Finally, patient has history of Vitamin D Deficiency and last vitamin D was 74 in Oct 2014.  Medication Sig  . aspirin EC 81 MG tablet Take 81 mg  daily.  . DepoTestosterone  200 MG/ML i 2 cc IM every 2 weeks and  #(10)   3 cc syringes   and   1 "  21 G needles   PMHx - negative except as above  Past Surgical History  Procedure Laterality Date  . Cataract extraction w/ intraocular lens implant Left 03/26/2013    Dr. Herbert Deaner   FHx (=) for Glioblastoma in Father, COPD and SDAT in Mother. Bro has ASCADs/p PTCA/Stents and HLD.   History   Social History  . Marital Status: Married    Spouse Name: N/A    Number of Children: 2 sons   . Years of Education: N/A   Occupational History  . Independent entrapreneur   Social History Main Topics  . Smoking status: Never Smoker   . Smokeless tobacco: Not on file  . Alcohol Use: No  . Drug Use: Not on file  . Sexual Activity: Not on file    ROS Constitutional: Denies fever, chills, weight loss/gain, headaches, insomnia, fatigue, night sweats, and change in appetite. Eyes: Denies redness, blurred vision, diplopia, discharge, itchy, watery eyes.  ENT: Denies discharge, congestion, post nasal drip, epistaxis, sore throat, earache, hearing loss, dental pain, Tinnitus, Vertigo, Sinus pain, snoring.  Cardio: Denies chest pain, palpitations, irregular heartbeat, syncope, dyspnea, diaphoresis, orthopnea, PND, claudication, edema Respiratory:  denies cough, dyspnea, DOE, pleurisy, hoarseness, laryngitis, wheezing.  Gastrointestinal: Denies dysphagia, heartburn, reflux, water brash, pain, cramps, nausea, vomiting, bloating, diarrhea, constipation, hematemesis, melena, hematochezia, jaundice, hemorrhoids Genitourinary: Denies dysuria, frequency, urgency, nocturia, hesitancy, discharge, hematuria, flank pain Breast: Breast lumps, nipple discharge, bleeding.  Musculoskeletal: Denies arthralgia, myalgia, stiffness, Jt. Swelling, pain, limp, and strain/sprain. Skin: Denies puritis, rash, hives, warts, acne, eczema, changing in skin lesion Neuro: Weakness, tremor, incoordination, spasms, paresthesia, pain Psychiatric: Denies confusion, memory loss, sensory loss. Endocrine: Denies change in weight, skin, hair change, nocturia, and paresthesia, diabetic polys, visual blurring, hyper /hypo glycemic episodes.  Heme/Lymph: No excessive bleeding, bruising, enlarged lymph nodes.   Physical Exam  BP 118/76  Pulse 72  Temp 97.6 F   Resp 16  Ht 5\' 11"    Wt 165 lb 9.6 oz   BMI 23.11 kg/m2  General Appearance: Well nourished and in no apparent distress. Eyes: PERRLA, EOMs, conjunctiva no swelling or erythema, normal fundi and vessels. Sinuses: No frontal/maxillary tenderness ENT/Mouth: EACs patent / TMs  nl. Nares clear without erythema, swelling, mucoid exudates. Oral hygiene is good. No erythema, swelling, or exudate. Tongue normal, non-obstructing. Tonsils not swollen or erythematous. Hearing normal.  Neck: Supple, thyroid normal. No bruits, nodes or JVD. Respiratory: Respiratory effort normal.  BS equal and clear bilateral without rales, rhonci, wheezing or stridor. Cardio: Heart sounds are normal with regular rate and rhythm and no murmurs,  rubs or gallops. Peripheral pulses are normal and equal bilaterally without edema. No aortic or femoral bruits. Chest: symmetric with normal excursions and percussion.  Abdomen: Flat, soft, with bowl  sounds. Nontender, no guarding, rebound, hernias, masses, or organomegaly.  Lymphatics: Non tender without lymphadenopathy.  Musculoskeletal: Full ROM all peripheral extremities, joint stability, 5/5 strength, and normal gait. Skin: Warm and dry without rashes, lesions, cyanosis, clubbing or  ecchymosis.  Neuro: Cranial nerves intact, reflexes equal bilaterally. Normal muscle tone, no cerebellar symptoms. Sensation intact.  Pysch: Awake and oriented X 3, normal affect, Insight and Judgment appropriate.   Assessment and Plan  1. Annual Screening Examination 2. ADD 3. Screening for  Elevated BP  Continue prudent diet as discussed, weight control, regular exercise, and medications. Routine screening labs and tests as requested with regular follow-up as recommended.

## 2014-03-14 LAB — HEPATITIS A ANTIBODY, TOTAL: Hep A Total Ab: BORDERLINE — AB

## 2014-03-14 LAB — HIV ANTIBODY (ROUTINE TESTING W REFLEX): HIV 1&2 Ab, 4th Generation: NONREACTIVE

## 2014-03-14 LAB — HEPATITIS C ANTIBODY: HCV Ab: NEGATIVE

## 2014-03-14 LAB — INSULIN, FASTING: Insulin fasting, serum: 18 u[IU]/mL (ref 3–28)

## 2014-03-14 LAB — HEMOGLOBIN A1C
Hgb A1c MFr Bld: 5.9 % — ABNORMAL HIGH (ref <5.7)
MEAN PLASMA GLUCOSE: 123 mg/dL — AB (ref <117)

## 2014-03-14 LAB — URINALYSIS, MICROSCOPIC ONLY
BACTERIA UA: NONE SEEN
CASTS: NONE SEEN
Squamous Epithelial / LPF: NONE SEEN

## 2014-03-14 LAB — MICROALBUMIN / CREATININE URINE RATIO
CREATININE, URINE: 166.4 mg/dL
MICROALB/CREAT RATIO: 3 mg/g (ref 0.0–30.0)
Microalb, Ur: 0.5 mg/dL (ref 0.00–1.89)

## 2014-03-14 LAB — HEPATITIS B SURFACE ANTIBODY,QUALITATIVE: Hep B S Ab: NEGATIVE

## 2014-03-14 LAB — TESTOSTERONE: Testosterone: 326 ng/dL (ref 300–890)

## 2014-03-14 LAB — HEPATITIS B CORE ANTIBODY, TOTAL: HEP B C TOTAL AB: NONREACTIVE

## 2014-03-14 LAB — RPR

## 2014-03-14 LAB — VITAMIN D 25 HYDROXY (VIT D DEFICIENCY, FRACTURES): Vit D, 25-Hydroxy: 36 ng/mL (ref 30–89)

## 2014-03-14 LAB — PSA: PSA: 0.46 ng/mL (ref <=4.00)

## 2014-03-15 LAB — HEPATITIS B E ANTIBODY: Hepatitis Be Antibody: NONREACTIVE

## 2014-04-26 ENCOUNTER — Encounter: Payer: Self-pay | Admitting: Gastroenterology

## 2014-05-30 ENCOUNTER — Other Ambulatory Visit: Payer: Self-pay | Admitting: Internal Medicine

## 2014-05-30 DIAGNOSIS — F988 Other specified behavioral and emotional disorders with onset usually occurring in childhood and adolescence: Secondary | ICD-10-CM

## 2014-05-30 MED ORDER — AMPHETAMINE-DEXTROAMPHETAMINE 20 MG PO TABS: ORAL_TABLET | ORAL | Status: DC

## 2014-08-23 ENCOUNTER — Other Ambulatory Visit: Payer: Self-pay | Admitting: Internal Medicine

## 2014-08-23 DIAGNOSIS — F988 Other specified behavioral and emotional disorders with onset usually occurring in childhood and adolescence: Secondary | ICD-10-CM

## 2014-08-23 MED ORDER — AMPHETAMINE-DEXTROAMPHETAMINE 20 MG PO TABS: ORAL_TABLET | ORAL | Status: DC

## 2014-11-02 ENCOUNTER — Other Ambulatory Visit: Payer: Self-pay | Admitting: Internal Medicine

## 2014-11-02 MED ORDER — AZITHROMYCIN 250 MG PO TABS: ORAL_TABLET | ORAL | Status: DC

## 2014-11-02 MED ORDER — PREDNISONE 20 MG PO TABS: ORAL_TABLET | ORAL | Status: DC

## 2014-11-02 MED ORDER — PROMETHAZINE-DM 6.25-15 MG/5ML PO SYRP: ORAL_SOLUTION | ORAL | Status: DC

## 2014-11-06 ENCOUNTER — Other Ambulatory Visit: Payer: Self-pay | Admitting: Internal Medicine

## 2014-11-06 DIAGNOSIS — E349 Endocrine disorder, unspecified: Secondary | ICD-10-CM

## 2014-11-06 MED ORDER — TESTOSTERONE CYPIONATE 200 MG/ML IM SOLN: INTRAMUSCULAR | Status: DC

## 2014-11-15 ENCOUNTER — Ambulatory Visit: Payer: Self-pay | Admitting: Internal Medicine

## 2014-11-15 ENCOUNTER — Encounter: Payer: Self-pay | Admitting: Internal Medicine

## 2014-11-15 NOTE — Progress Notes (Signed)
Patient ID: Frank Hunt, male   DOB: 06-Aug-1955, 59 y.o.   MRN: [id number]   Patient presents for evaluation of several superficial and sl raised scaly psoriaform lesions of the dorsal left forearm and right wrist. All 3 lesions were treated with liq N2 by triple freeze/thaw technique and patient tolerated the treatments w/o difficulty

## 2014-12-27 ENCOUNTER — Other Ambulatory Visit: Payer: Self-pay | Admitting: Internal Medicine

## 2014-12-27 DIAGNOSIS — F988 Other specified behavioral and emotional disorders with onset usually occurring in childhood and adolescence: Secondary | ICD-10-CM

## 2014-12-27 MED ORDER — AMPHETAMINE-DEXTROAMPHETAMINE 20 MG PO TABS: ORAL_TABLET | ORAL | Status: DC

## 2015-02-27 ENCOUNTER — Other Ambulatory Visit: Payer: Self-pay | Admitting: Internal Medicine

## 2015-02-27 DIAGNOSIS — J0410 Acute tracheitis without obstruction: Secondary | ICD-10-CM

## 2015-02-27 MED ORDER — LEVOFLOXACIN 500 MG PO TABS: ORAL_TABLET | ORAL | Status: DC

## 2015-02-27 MED ORDER — PREDNISONE 20 MG PO TABS: ORAL_TABLET | ORAL | Status: DC

## 2015-03-18 ENCOUNTER — Encounter: Payer: Self-pay | Admitting: Internal Medicine

## 2015-05-09 ENCOUNTER — Other Ambulatory Visit: Payer: Self-pay | Admitting: Internal Medicine

## 2015-05-09 DIAGNOSIS — F988 Other specified behavioral and emotional disorders with onset usually occurring in childhood and adolescence: Secondary | ICD-10-CM

## 2015-05-09 MED ORDER — AMPHETAMINE-DEXTROAMPHETAMINE 20 MG PO TABS: ORAL_TABLET | ORAL | Status: DC

## 2015-05-30 ENCOUNTER — Other Ambulatory Visit: Payer: Self-pay | Admitting: *Deleted

## 2015-05-30 DIAGNOSIS — E349 Endocrine disorder, unspecified: Secondary | ICD-10-CM

## 2015-05-30 MED ORDER — TESTOSTERONE CYPIONATE 200 MG/ML IM SOLN: INTRAMUSCULAR | Status: DC

## 2015-09-04 ENCOUNTER — Other Ambulatory Visit: Payer: Self-pay | Admitting: Internal Medicine

## 2015-09-04 DIAGNOSIS — F988 Other specified behavioral and emotional disorders with onset usually occurring in childhood and adolescence: Secondary | ICD-10-CM

## 2015-09-04 MED ORDER — AMPHETAMINE-DEXTROAMPHETAMINE 20 MG PO TABS: ORAL_TABLET | ORAL | Status: DC

## 2015-12-18 ENCOUNTER — Other Ambulatory Visit: Payer: Self-pay | Admitting: Internal Medicine

## 2015-12-18 DIAGNOSIS — E349 Endocrine disorder, unspecified: Secondary | ICD-10-CM

## 2015-12-18 MED ORDER — TESTOSTERONE CYPIONATE 200 MG/ML IM SOLN: INTRAMUSCULAR | Status: DC

## 2016-03-08 ENCOUNTER — Other Ambulatory Visit: Payer: Self-pay | Admitting: *Deleted

## 2016-03-08 DIAGNOSIS — F988 Other specified behavioral and emotional disorders with onset usually occurring in childhood and adolescence: Secondary | ICD-10-CM

## 2016-03-08 MED ORDER — AMPHETAMINE-DEXTROAMPHETAMINE 20 MG PO TABS: ORAL_TABLET | ORAL | 0 refills | Status: DC

## 2016-04-06 ENCOUNTER — Encounter: Payer: Self-pay | Admitting: Internal Medicine

## 2016-07-16 ENCOUNTER — Other Ambulatory Visit: Payer: Self-pay | Admitting: Internal Medicine

## 2016-07-16 DIAGNOSIS — F900 Attention-deficit hyperactivity disorder, predominantly inattentive type: Secondary | ICD-10-CM

## 2016-07-16 MED ORDER — AMPHETAMINE-DEXTROAMPHETAMINE 20 MG PO TABS: ORAL_TABLET | ORAL | 0 refills | Status: DC

## 2016-10-01 ENCOUNTER — Other Ambulatory Visit: Payer: Self-pay | Admitting: Internal Medicine

## 2016-10-01 DIAGNOSIS — E349 Endocrine disorder, unspecified: Secondary | ICD-10-CM

## 2016-10-01 MED ORDER — TESTOSTERONE CYPIONATE 200 MG/ML IM SOLN: INTRAMUSCULAR | 5 refills | Status: DC

## 2016-10-15 ENCOUNTER — Encounter: Payer: Self-pay | Admitting: Internal Medicine

## 2016-10-15 ENCOUNTER — Ambulatory Visit: Payer: Self-pay | Admitting: Internal Medicine

## 2016-10-15 VITALS — BP 130/80 | HR 72 | Resp 12

## 2016-10-15 DIAGNOSIS — E559 Vitamin D deficiency, unspecified: Secondary | ICD-10-CM

## 2016-10-15 DIAGNOSIS — Z0000 Encounter for general adult medical examination without abnormal findings: Secondary | ICD-10-CM

## 2016-10-15 DIAGNOSIS — R7303 Prediabetes: Secondary | ICD-10-CM

## 2016-10-15 DIAGNOSIS — E782 Mixed hyperlipidemia: Secondary | ICD-10-CM | POA: Insufficient documentation

## 2016-10-15 DIAGNOSIS — M5441 Lumbago with sciatica, right side: Secondary | ICD-10-CM | POA: Insufficient documentation

## 2016-10-15 DIAGNOSIS — R5383 Other fatigue: Secondary | ICD-10-CM

## 2016-10-15 DIAGNOSIS — Z79899 Other long term (current) drug therapy: Secondary | ICD-10-CM

## 2016-10-15 LAB — LIPID PANEL
CHOL/HDL RATIO: 2 ratio (ref <5.0)
Cholesterol: 195 mg/dL (ref <200)
HDL: 97 mg/dL (ref >40)
LDL Cholesterol: 74 mg/dL (ref <100)
Triglycerides: 118 mg/dL (ref <150)
VLDL: 24 mg/dL (ref <30)

## 2016-10-15 LAB — CBC WITH DIFFERENTIAL/PLATELET
BASOS ABS: 0 {cells}/uL (ref 0–200)
BASOS PCT: 0 %
EOS ABS: 165 {cells}/uL (ref 15–500)
Eosinophils Relative: 3 %
HEMATOCRIT: 41.9 % (ref 38.5–50.0)
Hemoglobin: 13.7 g/dL (ref 13.2–17.1)
LYMPHS PCT: 18 %
Lymphs Abs: 990 cells/uL (ref 850–3900)
MCH: 29.1 pg (ref 27.0–33.0)
MCHC: 32.7 g/dL (ref 32.0–36.0)
MCV: 89.1 fL (ref 80.0–100.0)
MONO ABS: 495 {cells}/uL (ref 200–950)
MPV: 8.8 fL (ref 7.5–12.5)
Monocytes Relative: 9 %
NEUTROS PCT: 70 %
Neutro Abs: 3850 cells/uL (ref 1500–7800)
Platelets: 196 10*3/uL (ref 140–400)
RBC: 4.7 MIL/uL (ref 4.20–5.80)
RDW: 14.3 % (ref 11.0–15.0)
WBC: 5.5 10*3/uL (ref 3.8–10.8)

## 2016-10-15 LAB — TSH: TSH: 1.97 mIU/L (ref 0.40–4.50)

## 2016-10-15 MED ORDER — PREDNISONE 20 MG PO TABS: ORAL_TABLET | ORAL | 0 refills | Status: DC

## 2016-10-15 MED ORDER — MELOXICAM 15 MG PO TABS: ORAL_TABLET | ORAL | 1 refills | Status: AC

## 2016-10-15 NOTE — Patient Instructions (Signed)
Back Injury Prevention °Back injuries can be very painful. They can also be difficult to heal. After having one back injury, you are more likely to injure your back again. It is important to learn how to avoid injuring or re-injuring your back. The following tips can help you to prevent a back injury. °What should I know about physical fitness? °· Exercise for 30 minutes per day on most days of the week or as directed by your health care provider. Make sure to: °? Do aerobic exercises, such as walking, jogging, biking, or swimming. °? Do exercises that increase balance and strength, such as tai chi and yoga. These can decrease your risk of falling and injuring your back. °? Do stretching exercises to help with flexibility. °? Try to develop strong abdominal muscles. Your abdominal muscles provide a lot of the support that is needed by your back. °· Maintain a healthy weight. This helps to decrease your risk of a back injury. °What should I know about my diet? °· Talk with your health care provider about your overall diet. Take supplements and vitamins only as directed by your health care provider. °· Talk with your health care provider about how much calcium and vitamin D you need each day. These nutrients help to prevent weakening of the bones (osteoporosis). Osteoporosis can cause broken (fractured) bones, which lead to back pain. °· Include good sources of calcium in your diet, such as dairy products, green leafy vegetables, and products that have had calcium added to them (fortified). °· Include good sources of vitamin D in your diet, such as milk and foods that are fortified with vitamin D. °What should I know about my posture? °· Sit up straight and stand up straight. Avoid leaning forward when you sit or hunching over when you stand. °· Choose chairs that have good low-back (lumbar) support. °· If you work at a desk, sit close to it so you do not need to lean over. Keep your chin tucked in. Keep your neck  drawn back, and keep your elbows bent at a right angle. Your arms should look like the letter "L." °· Sit high and close to the steering wheel when you drive. Add a lumbar support to your car seat, if needed. °· Avoid sitting or standing in one position for very long. Take breaks to get up, stretch, and walk around at least one time every hour. Take breaks every hour if you are driving for long periods of time. °· Sleep on your side with your knees slightly bent, or sleep on your back with a pillow under your knees. Do not lie on the front of your body to sleep. °What should I know about lifting, twisting, and reaching? °Lifting and Heavy Lifting ° °· Avoid heavy lifting, especially repetitive heavy lifting. If you must do heavy lifting: °? Stretch before lifting. °? Work slowly. °? Rest between lifts. °? Use a tool such as a cart or a dolly to move objects if one is available. °? Make several small trips instead of carrying one heavy load. °? Ask for help when you need it, especially when moving big objects. °· Follow these steps when lifting: °? Stand with your feet shoulder-width apart. °? Get as close to the object as you can. Do not try to pick up a heavy object that is far from your body. °? Use handles or lifting straps if they are available. °? Bend at your knees. Squat down, but keep your heels off the   floor. °? Keep your shoulders pulled back, your chin tucked in, and your back straight. °? Lift the object slowly while you tighten the muscles in your legs, abdomen, and buttocks. Keep the object as close to the center of your body as possible. °· Follow these steps when putting down a heavy load: °? Stand with your feet shoulder-width apart. °? Lower the object slowly while you tighten the muscles in your legs, abdomen, and buttocks. Keep the object as close to the center of your body as possible. °? Keep your shoulders pulled back, your chin tucked in, and your back straight. °? Bend at your knees. Squat  down, but keep your heels off the floor. °? Use handles or lifting straps if they are available. °Twisting and Reaching °· Avoid lifting heavy objects above your waist. °· Do not twist at your waist while you are lifting or carrying a load. If you need to turn, move your feet. °· Do not bend over without bending at your knees. °· Avoid reaching over your head, across a table, or for an object on a high surface. °What are some other tips? °· Avoid wet floors and icy ground. Keep sidewalks clear of ice to prevent falls. °· Do not sleep on a mattress that is too soft or too hard. °· Keep items that are used frequently within easy reach. °· Put heavier objects on shelves at waist level, and put lighter objects on lower or higher shelves. °· Find ways to decrease your stress, such as exercise, massage, or relaxation techniques. Stress can build up in your muscles. Tense muscles are more vulnerable to injury. °· Talk with your health care provider if you feel anxious or depressed. These conditions can make back pain worse. °· Wear flat heel shoes with cushioned soles. °· Avoid sudden movements. °· Use both shoulder straps when carrying a backpack. °· Do not use any tobacco products, including cigarettes, chewing tobacco, or electronic cigarettes. If you need help quitting, ask your health care provider. °This information is not intended to replace advice given to you by your health care provider. Make sure you discuss any questions you have with your health care provider. °Document Released: 08/19/2004 Document Revised: 12/18/2015 Document Reviewed: 07/16/2014 °Elsevier Interactive Patient Education © 2017 [employer] ° °

## 2016-10-15 NOTE — Progress Notes (Signed)
  Subjective:    Patient ID: Frank Hunt, male    DOB: 12/21/1955, 61 y.o.   MRN: [id number]  HPI  This nice 61 yo MWM approx 6 weeks w/o discrete injury, but noted exacerbations after prolonged sitting at a drawing board or heavy lifting with neg PMH presents with c/o R LBP radiating into the R buttock and had been taking his wife stockpile of prednisone 20 mg tabs up to 1-3 tabs / day  for most days of the week until he depleted the supply about a week ago and now his pain has again worsened.    Also has c/o fatigue.   Medication Sig  . ADDERALL 20 MG  1/2 to 1 tablet 2 to  3 x daily for ADD  . aspirin EC 81 MG  Take 81 mg by mouth daily.  Marland Kitchen EFUDEX 5 % cream   . ACULAR 0.5 % ophth soln Place 1 drop into both eyes 3 (three) times daily.   Marland Kitchen testosterone cypio 200 MG injec 2 cc intramuscular every 2 weeks and  #(10)   3 cc syringes   and   1 "  21 G needles   NKA  Neg PMHx  Review of Systems  10 point systems review negative except as above.    Objective:   Physical Exam  BP 130/80   P 72   R 12    T 97.5  Nl Gait  Directed Exam finds a normal gait. DTR's of LE's are brisk & equal bilaterally. Neg SLR and Fig 4 bilaterally.    Assessment & Plan:   1. Low back pain with right-sided sciatica  - discussed proper back mechanics, lifting, lumbar cushion, etc.   - educated in concern for unrestricted steroid use wrt complications of steroid therapy  - predniSONE (DELTASONE) 20 MG tablet; 1 tab 3 x day for 3 days, then 1 tab 2 x day for 3 days, then 1 tab 1 x day for 5 days  Dispense: 20 tablet; Refill: 0  - meloxicam (MOBIC) 15 MG tablet; Take 1/2 to 1 tablet daily with food for pain & Inflammation  Dispense: 90 tablet; Refill: 1  2. Mixed hyperlipidemia  - Lipid panel  3. Prediabetes  - Hemoglobin A1c  4. Vitamin D deficiency  - VITAMIN D 25 Hydroxy   5. Fatigue  - TSH - CBC with Differential/Platelet  6. Medication management

## 2016-10-16 LAB — VITAMIN D 25 HYDROXY (VIT D DEFICIENCY, FRACTURES): Vit D, 25-Hydroxy: 33 ng/mL (ref 30–100)

## 2016-10-16 LAB — HEMOGLOBIN A1C
Hgb A1c MFr Bld: 5.5 % (ref <5.7)
MEAN PLASMA GLUCOSE: 111 mg/dL

## 2016-11-05 ENCOUNTER — Other Ambulatory Visit: Payer: Self-pay | Admitting: *Deleted

## 2016-11-05 DIAGNOSIS — F900 Attention-deficit hyperactivity disorder, predominantly inattentive type: Secondary | ICD-10-CM

## 2016-11-05 MED ORDER — AMPHETAMINE-DEXTROAMPHETAMINE 20 MG PO TABS: ORAL_TABLET | ORAL | 0 refills | Status: DC

## 2017-02-04 ENCOUNTER — Other Ambulatory Visit: Payer: Self-pay | Admitting: Internal Medicine

## 2017-02-04 MED ORDER — AMPHETAMINE-DEXTROAMPHET ER 30 MG PO CP24: ORAL_CAPSULE | ORAL | 0 refills | Status: DC

## 2017-03-08 ENCOUNTER — Other Ambulatory Visit: Payer: Self-pay | Admitting: Internal Medicine

## 2017-03-08 MED ORDER — AMPHETAMINE-DEXTROAMPHET ER 30 MG PO CP24: ORAL_CAPSULE | ORAL | 0 refills | Status: DC

## 2017-04-18 ENCOUNTER — Other Ambulatory Visit: Payer: Self-pay | Admitting: *Deleted

## 2017-04-18 DIAGNOSIS — E349 Endocrine disorder, unspecified: Secondary | ICD-10-CM

## 2017-04-18 MED ORDER — TESTOSTERONE CYPIONATE 200 MG/ML IM SOLN: INTRAMUSCULAR | 5 refills | Status: DC

## 2017-06-13 ENCOUNTER — Other Ambulatory Visit: Payer: Self-pay | Admitting: Internal Medicine

## 2017-06-13 DIAGNOSIS — E349 Endocrine disorder, unspecified: Secondary | ICD-10-CM

## 2017-06-13 MED ORDER — TESTOSTERONE CYPIONATE 200 MG/ML IM SOLN: INTRAMUSCULAR | 2 refills | Status: DC

## 2017-06-20 DIAGNOSIS — M25542 Pain in joints of left hand: Secondary | ICD-10-CM | POA: Insufficient documentation

## 2017-07-27 ENCOUNTER — Other Ambulatory Visit: Payer: Self-pay | Admitting: Internal Medicine

## 2017-07-27 MED ORDER — AMPHETAMINE-DEXTROAMPHETAMINE 20 MG PO TABS: ORAL_TABLET | ORAL | 0 refills | Status: DC

## 2017-11-03 ENCOUNTER — Other Ambulatory Visit: Payer: Self-pay | Admitting: Internal Medicine

## 2017-11-03 MED ORDER — AMPHETAMINE-DEXTROAMPHETAMINE 20 MG PO TABS: ORAL_TABLET | ORAL | 0 refills | Status: DC

## 2018-02-06 ENCOUNTER — Other Ambulatory Visit: Payer: Self-pay | Admitting: Internal Medicine

## 2018-02-06 DIAGNOSIS — F900 Attention-deficit hyperactivity disorder, predominantly inattentive type: Secondary | ICD-10-CM

## 2018-02-06 MED ORDER — AMPHETAMINE-DEXTROAMPHETAMINE 20 MG PO TABS: ORAL_TABLET | ORAL | 0 refills | Status: DC

## 2018-05-10 ENCOUNTER — Other Ambulatory Visit: Payer: Self-pay | Admitting: *Deleted

## 2018-05-10 DIAGNOSIS — F900 Attention-deficit hyperactivity disorder, predominantly inattentive type: Secondary | ICD-10-CM

## 2018-05-10 MED ORDER — AMPHETAMINE-DEXTROAMPHETAMINE 20 MG PO TABS: ORAL_TABLET | ORAL | 0 refills | Status: DC

## 2018-08-09 ENCOUNTER — Other Ambulatory Visit: Payer: Self-pay | Admitting: Internal Medicine

## 2018-08-09 DIAGNOSIS — F900 Attention-deficit hyperactivity disorder, predominantly inattentive type: Secondary | ICD-10-CM

## 2018-08-09 MED ORDER — AMPHETAMINE-DEXTROAMPHETAMINE 20 MG PO TABS: ORAL_TABLET | ORAL | 0 refills | Status: DC

## 2018-08-24 ENCOUNTER — Other Ambulatory Visit: Payer: Self-pay | Admitting: Internal Medicine

## 2018-08-24 MED ORDER — PREDNISONE 20 MG PO TABS: ORAL_TABLET | ORAL | 0 refills | Status: DC

## 2018-08-24 MED ORDER — TRAMADOL HCL 50 MG PO TABS: ORAL_TABLET | ORAL | 0 refills | Status: DC

## 2018-09-08 ENCOUNTER — Other Ambulatory Visit: Payer: Self-pay | Admitting: Internal Medicine

## 2018-09-08 DIAGNOSIS — M5432 Sciatica, left side: Secondary | ICD-10-CM

## 2018-09-08 MED ORDER — TRAMADOL HCL 50 MG PO TABS: ORAL_TABLET | ORAL | 0 refills | Status: DC

## 2018-09-17 ENCOUNTER — Other Ambulatory Visit: Payer: Self-pay

## 2018-09-23 ENCOUNTER — Ambulatory Visit
Admission: RE | Admit: 2018-09-23 | Discharge: 2018-09-23 | Disposition: A | Discharge status: Home / Self Care | Payer: Self-pay | Source: Ambulatory Visit | Attending: Internal Medicine | Admitting: Internal Medicine

## 2018-09-23 DIAGNOSIS — M5432 Sciatica, left side: Secondary | ICD-10-CM

## 2018-09-25 ENCOUNTER — Other Ambulatory Visit: Payer: Self-pay | Admitting: Internal Medicine

## 2018-09-25 MED ORDER — TRAMADOL HCL 50 MG PO TABS: ORAL_TABLET | ORAL | 0 refills | Status: DC

## 2018-09-25 MED ORDER — PREDNISONE 50 MG PO TABS: ORAL_TABLET | ORAL | 0 refills | Status: DC

## 2018-09-27 DIAGNOSIS — R030 Elevated blood-pressure reading, without diagnosis of hypertension: Secondary | ICD-10-CM | POA: Diagnosis not present

## 2018-09-27 DIAGNOSIS — G8929 Other chronic pain: Secondary | ICD-10-CM | POA: Diagnosis not present

## 2018-09-27 DIAGNOSIS — M4316 Spondylolisthesis, lumbar region: Secondary | ICD-10-CM | POA: Diagnosis not present

## 2018-09-27 DIAGNOSIS — M4310 Spondylolisthesis, site unspecified: Secondary | ICD-10-CM | POA: Insufficient documentation

## 2018-09-27 DIAGNOSIS — M545 Low back pain, unspecified: Secondary | ICD-10-CM | POA: Insufficient documentation

## 2018-09-27 DIAGNOSIS — M5442 Lumbago with sciatica, left side: Secondary | ICD-10-CM | POA: Diagnosis not present

## 2018-10-09 DIAGNOSIS — M4316 Spondylolisthesis, lumbar region: Secondary | ICD-10-CM | POA: Diagnosis not present

## 2018-10-09 DIAGNOSIS — M48062 Spinal stenosis, lumbar region with neurogenic claudication: Secondary | ICD-10-CM | POA: Diagnosis not present

## 2018-10-09 DIAGNOSIS — M5416 Radiculopathy, lumbar region: Secondary | ICD-10-CM | POA: Diagnosis not present

## 2018-10-13 ENCOUNTER — Other Ambulatory Visit: Payer: Self-pay | Admitting: Internal Medicine

## 2018-10-13 MED ORDER — AZITHROMYCIN 250 MG PO TABS: ORAL_TABLET | ORAL | 1 refills | Status: DC

## 2018-10-16 ENCOUNTER — Other Ambulatory Visit: Payer: Self-pay

## 2018-10-16 ENCOUNTER — Ambulatory Visit (INDEPENDENT_AMBULATORY_CARE_PROVIDER_SITE_OTHER): Payer: PPO | Admitting: Internal Medicine

## 2018-10-16 VITALS — BP 131/83 | HR 88 | Temp 98.2°F

## 2018-10-16 DIAGNOSIS — J101 Influenza due to other identified influenza virus with other respiratory manifestations: Secondary | ICD-10-CM

## 2018-10-16 DIAGNOSIS — Z23 Encounter for immunization: Secondary | ICD-10-CM

## 2018-10-16 LAB — POC INFLUENZA A&B (BINAX/QUICKVUE)
Influenza A, POC: POSITIVE — AB
Influenza B, POC: NEGATIVE

## 2018-10-17 ENCOUNTER — Encounter: Payer: Self-pay | Admitting: Internal Medicine

## 2018-10-17 NOTE — Progress Notes (Signed)
   Subjective:    Patient ID: Chin Wachter, male    DOB: 01/24/1956, 63 y.o.   MRN: [id number]  HPI    Patient is a very nice 63 yo MWM in general good health who presents with a 2 day hx of fever/sweats/myalgias with temps up to 101.2 deg. Denies head/chest congestion, cough, rigors, dyspnea or rash. Taking Tylenol prn.  Medication Sig  . ADDERALL 20 MG tablet Take 1 tablet 1 to 2 x /day as needed for ADD  . aspirin EC 81 MG  Take 81 mg by mouth daily.  Marland Kitchen EFUDEX 5 % cream   . ACULAR) 0.5 % ophth soln Place 1 drop into both eyes 3 (three) times daily.   . traMADol 50 MG tablet Take 1 to 2 tablets every 4 hours as needed for SeverePain  . testosterone cypio 200 MG/ML injection 2 cc intramuscular every 2 weeks and  #(10)   3 cc syringes   and   1 "  21 G needles   NKA  Neg. PMHx  Review of Systems     10 point systems review negative except as above.    Objective:   Physical Exam  BP 131/83   Pulse 88   Temp 98.2 F (36.8 C)   HEENT - WNL. Neck - supple.  Chest - Clear equal BS. Cor - Nl HS. RRR w/o sig MGR. PP 1(+). No edema. MS- FROM w/o deformities.  Gait Nl. Neuro -  Nl w/o focal abnormalities. Skin - No rash, icterus or cyanosis.  Rapid Flu test (+) Flu A     Assessment & Plan:   1. Influenza A  - POC Influenza A&B(BINAX/QUICKVUE)

## 2018-10-17 NOTE — Patient Instructions (Signed)
Influenza  Influenza, more commonly known as "the flu," is a viral infection that mainly affects the respiratory tract. The respiratory tract includes organs that help you breathe, such as the lungs, nose, and throat. The flu causes many symptoms similar to the common cold along with high fever and body aches. The flu spreads easily from person to person (is contagious). Getting a flu shot (influenza vaccination) every year is the best way to prevent the flu. What are the causes? This condition is caused by the influenza virus. You can get the virus by:  Breathing in droplets that are in the air from an infected person's cough or sneeze.  Touching something that has been exposed to the virus (has been contaminated) and then touching your mouth, nose, or eyes. What increases the risk? The following factors may make you more likely to get the flu:  Not washing or sanitizing your hands often.  Having close contact with many people during cold and flu season.  Touching your mouth, eyes, or nose without first washing or sanitizing your hands.  Not getting a yearly (annual) flu shot. You may have a higher risk for the flu, including serious problems such as a lung infection (pneumonia), if you:  Are older than 65.  Are pregnant.  Have a weakened disease-fighting system (immune system). You may have a weakened immune system if you: ? Have HIV or AIDS. ? Are undergoing chemotherapy. ? Are taking medicines that reduce (suppress) the activity of your immune system.  Have a long-term (chronic) illness, such as heart disease, kidney disease, diabetes, or lung disease.  Have a liver disorder.  Are severely overweight (morbidly obese).  Have anemia. This is a condition that affects your red blood cells.  Have asthma. What are the signs or symptoms? Symptoms of this condition usually begin suddenly and last 4-14 days. They may include:  Fever and chills.  Headaches, body aches, or  muscle aches.  Sore throat.  Cough.  Runny or stuffy (congested) nose.  Chest discomfort.  Poor appetite.  Weakness or fatigue.  Dizziness.  Nausea or vomiting. How is this diagnosed? This condition may be diagnosed based on:  Your symptoms and medical history.  A physical exam.  Swabbing your nose or throat and testing the fluid for the influenza virus. How is this treated? If the flu is diagnosed early, you can be treated with medicine that can help reduce how severe the illness is and how long it lasts (antiviral medicine). This may be given by mouth (orally) or through an IV. Taking care of yourself at home can help relieve symptoms. Your health care provider may recommend:  Taking over-the-counter medicines.  Drinking plenty of fluids. In many cases, the flu goes away on its own. If you have severe symptoms or complications, you may be treated in a hospital. Follow these instructions at home: Activity  Rest as needed and get plenty of sleep.  Stay home from work or school as told by your health care provider. Unless you are visiting your health care provider, avoid leaving home until your fever has been gone for 24 hours without taking medicine. Eating and drinking  Take an oral rehydration solution (ORS). This is a drink that is sold at pharmacies and retail stores.  Drink enough fluid to keep your urine pale yellow.  Drink clear fluids in small amounts as you are able. Clear fluids include water, ice chips, diluted fruit juice, and low-calorie sports drinks.  Eat bland, easy-to-digest foods  in small amounts as you are able. These foods include bananas, applesauce, rice, lean meats, toast, and crackers.  Avoid drinking fluids that contain a lot of sugar or caffeine, such as energy drinks, regular sports drinks, and soda.  Avoid alcohol.  Avoid spicy or fatty foods. General instructions      Take over-the-counter and prescription medicines only as told  by your health care provider.  Use a cool mist humidifier to add humidity to the air in your home. This can make it easier to breathe.  Cover your mouth and nose when you cough or sneeze.  Wash your hands with soap and water often, especially after you cough or sneeze. If soap and water are not available, use alcohol-based hand sanitizer.  Keep all follow-up visits as told by your health care provider. This is important. How is this prevented?   Get an annual flu shot. You may get the flu shot in late summer, fall, or winter. Ask your health care provider when you should get your flu shot.  Avoid contact with people who are sick during cold and flu season. This is generally fall and winter. Contact a health care provider if:  You develop new symptoms.  You have: ? Chest pain. ? Diarrhea. ? A fever.  Your cough gets worse.  You produce more mucus.  You feel nauseous or you vomit. Get help right away if:  You develop shortness of breath or difficulty breathing.  Your skin or nails turn a bluish color.  You have severe pain or stiffness in your neck.  You develop a sudden headache or sudden pain in your face or ear.  You cannot eat or drink without vomiting. Summary  Influenza, more commonly known as "the flu," is a viral infection that primarily affects your respiratory tract.  Symptoms of the flu usually begin suddenly and last 4-14 days.  Getting an annual flu shot is the best way to prevent getting the flu.  Stay home from work or school as told by your health care provider. Unless you are visiting your health care provider, avoid leaving home until your fever has been gone for 24 hours without taking medicine.  Keep all follow-up visits as told by your health care provider. This is important.

## 2018-10-26 ENCOUNTER — Other Ambulatory Visit: Payer: Self-pay | Admitting: Internal Medicine

## 2018-10-26 MED ORDER — TRAMADOL HCL 50 MG PO TABS: ORAL_TABLET | ORAL | 0 refills | Status: DC

## 2018-10-27 DIAGNOSIS — M5416 Radiculopathy, lumbar region: Secondary | ICD-10-CM | POA: Diagnosis not present

## 2018-10-27 DIAGNOSIS — M48062 Spinal stenosis, lumbar region with neurogenic claudication: Secondary | ICD-10-CM | POA: Diagnosis not present

## 2018-11-20 ENCOUNTER — Other Ambulatory Visit: Payer: Self-pay | Admitting: Internal Medicine

## 2018-11-20 MED ORDER — AMPHETAMINE-DEXTROAMPHETAMINE 20 MG PO TABS: ORAL_TABLET | ORAL | 0 refills | Status: DC

## 2018-12-30 ENCOUNTER — Other Ambulatory Visit (INDEPENDENT_AMBULATORY_CARE_PROVIDER_SITE_OTHER): Payer: PPO | Admitting: Internal Medicine

## 2018-12-30 DIAGNOSIS — S61212A Laceration without foreign body of right middle finger without damage to nail, initial encounter: Secondary | ICD-10-CM | POA: Diagnosis not present

## 2018-12-30 NOTE — Progress Notes (Signed)
[facility]   Unk Pinto, M.D.    Uvaldo Bristle. Silverio Lay, P.A.-C      [name], [place]                [address] [place] [address] Telephone [phone] Telefax [phone] _________________________________   Subjective:    Patient ID: Frank Hunt, male    DOB: 28-Feb-1956, 63 y.o.   MRN: [id number]  HPI       Patient is a very nice 63 yo MWM who injured his Right middle / long finger doing a workshop project sustaining a laceration to the finger. Patient is met emergently at the office to assess & repair so as to avoid an ER visit.   Review of Systems    10 point systems review negative except as above.    Objective:   Physical Exam  HEENT - WNL. Neck - supple.  Chest - Clear equal BS. Cor - Nl HS. RRR w/o sig MGR. PP 1(+). No edema. MS- FROM w/o deformities.  Gait Nl. Rt loing finger has normal sensory -motor exam Neuro -  Nl w/o focal abnormalities.  Skin - There is a 2 cm laceration of the proximal dorsal Rt long finger just proximal to the PIP knuckle.  Procedure (CPT - 90689)      After informed consent and aseptic prep, there was injection of 2 ml of Marcaine 0.5% to the open wound. The irregular wound edges were debrided and the wound was cleansed with Betadine. The wound edges were approximated with 2 vertical mattress sutures of Nylon 4-0 and then the edges were aligned and everted with # 4 interrupted sutures with Nylon 4-0. Antibiotic ointment and sterile bulk dressing were applied.   Patient was instructed in post-op wound care & recc ROV 12 days for suture removal    Assessment & Plan:    1. Laceration of right middle finger without foreign body without damage to nail, initial encounter

## 2019-01-11 ENCOUNTER — Other Ambulatory Visit: Payer: Self-pay | Admitting: Internal Medicine

## 2019-01-23 ENCOUNTER — Other Ambulatory Visit: Payer: Self-pay | Admitting: Internal Medicine

## 2019-01-23 DIAGNOSIS — M5416 Radiculopathy, lumbar region: Secondary | ICD-10-CM | POA: Diagnosis not present

## 2019-01-23 DIAGNOSIS — R030 Elevated blood-pressure reading, without diagnosis of hypertension: Secondary | ICD-10-CM | POA: Diagnosis not present

## 2019-01-23 DIAGNOSIS — M48062 Spinal stenosis, lumbar region with neurogenic claudication: Secondary | ICD-10-CM | POA: Diagnosis not present

## 2019-01-23 MED ORDER — MELOXICAM 15 MG PO TABS: ORAL_TABLET | ORAL | 1 refills | Status: DC

## 2019-02-19 ENCOUNTER — Other Ambulatory Visit: Payer: Self-pay | Admitting: Internal Medicine

## 2019-02-19 MED ORDER — PREGABALIN 75 MG PO CAPS: ORAL_CAPSULE | ORAL | 1 refills | Status: DC

## 2019-02-26 ENCOUNTER — Other Ambulatory Visit: Payer: Self-pay

## 2019-03-01 ENCOUNTER — Other Ambulatory Visit: Payer: Self-pay | Admitting: Internal Medicine

## 2019-03-01 MED ORDER — AMPHETAMINE-DEXTROAMPHETAMINE 20 MG PO TABS: ORAL_TABLET | ORAL | 0 refills | Status: DC

## 2019-06-12 DIAGNOSIS — H35033 Hypertensive retinopathy, bilateral: Secondary | ICD-10-CM | POA: Diagnosis not present

## 2019-06-12 DIAGNOSIS — H43813 Vitreous degeneration, bilateral: Secondary | ICD-10-CM | POA: Diagnosis not present

## 2019-06-12 DIAGNOSIS — H524 Presbyopia: Secondary | ICD-10-CM | POA: Diagnosis not present

## 2019-06-12 DIAGNOSIS — H35372 Puckering of macula, left eye: Secondary | ICD-10-CM | POA: Diagnosis not present

## 2019-06-12 DIAGNOSIS — Z961 Presence of intraocular lens: Secondary | ICD-10-CM | POA: Diagnosis not present

## 2019-06-14 ENCOUNTER — Encounter: Payer: Self-pay | Admitting: Internal Medicine

## 2019-06-14 ENCOUNTER — Other Ambulatory Visit: Payer: Self-pay

## 2019-07-05 DIAGNOSIS — H43812 Vitreous degeneration, left eye: Secondary | ICD-10-CM | POA: Diagnosis not present

## 2019-07-05 DIAGNOSIS — H43811 Vitreous degeneration, right eye: Secondary | ICD-10-CM | POA: Diagnosis not present

## 2019-09-16 ENCOUNTER — Ambulatory Visit: Payer: PPO | Attending: Internal Medicine

## 2019-09-16 DIAGNOSIS — Z23 Encounter for immunization: Secondary | ICD-10-CM

## 2019-09-16 NOTE — Progress Notes (Signed)
   Covid-19 Vaccination Clinic  Name:  Frank Hunt    MRN: [id number] DOB: Feb 22, 1956  09/16/2019  Frank Hunt was observed post Covid-19 immunization for 15 minutes without incidence. He was provided with Vaccine Information Sheet and instruction to access the V-Safe system.   Frank Hunt was instructed to call 911 with any severe reactions post vaccine: Marland Kitchen Difficulty breathing  . Swelling of your face and throat  . A fast heartbeat  . A bad rash all over your body  . Dizziness and weakness    Immunizations Administered    Name Date Dose VIS Date Route   Pfizer COVID-19 Vaccine 09/16/2019 10:10 AM 0.3 mL 07/06/2019 Intramuscular   Manufacturer: [place]   Lot: [id number]   [place]: [id number]

## 2019-10-10 ENCOUNTER — Ambulatory Visit: Payer: PPO | Attending: Internal Medicine

## 2019-10-10 DIAGNOSIS — Z23 Encounter for immunization: Secondary | ICD-10-CM

## 2019-10-10 NOTE — Progress Notes (Signed)
   Covid-19 Vaccination Clinic  Name:  [name]    MRN: [id number] DOB: 1956/02/26  10/10/2019  [name] was observed post Covid-19 immunization for 15 minutes without incident. He was provided with Vaccine Information Sheet and instruction to access the V-Safe system.   [name] was instructed to call 911 with any severe reactions post vaccine: Marland Kitchen Difficulty breathing  . Swelling of face and throat  . A fast heartbeat  . A bad rash all over body  . Dizziness and weakness   Immunizations Administered    Name Date Dose VIS Date Route   Pfizer COVID-19 Vaccine 10/10/2019  9:39 AM 0.3 mL 07/06/2019 Intramuscular   Manufacturer: [place]   Lot: [id number]   [place]: [id number]

## 2019-11-28 ENCOUNTER — Other Ambulatory Visit: Payer: Self-pay | Admitting: Internal Medicine

## 2019-11-28 MED ORDER — DEXAMETHASONE 4 MG PO TABS: ORAL_TABLET | ORAL | 0 refills | Status: DC

## 2019-12-28 ENCOUNTER — Other Ambulatory Visit: Payer: Self-pay | Admitting: Internal Medicine

## 2019-12-28 MED ORDER — IPRATROPIUM BROMIDE 0.06 % NA SOLN: NASAL | 3 refills | Status: DC

## 2020-02-01 ENCOUNTER — Other Ambulatory Visit: Payer: Self-pay | Admitting: Internal Medicine

## 2020-02-18 ENCOUNTER — Other Ambulatory Visit: Payer: Self-pay | Admitting: Internal Medicine

## 2020-02-18 MED ORDER — HYDROMORPHONE HCL 4 MG PO TABS: ORAL_TABLET | ORAL | 0 refills | Status: DC

## 2020-02-18 NOTE — Progress Notes (Signed)
   10:30 pm   Suspected torn / ruptured Achilles - Rx sent in t for Dilaudid 4 mg #30 til can schedule Ortho Eval in am

## 2020-02-19 DIAGNOSIS — S86011A Strain of right Achilles tendon, initial encounter: Secondary | ICD-10-CM | POA: Diagnosis not present

## 2020-02-20 DIAGNOSIS — S86011A Strain of right Achilles tendon, initial encounter: Secondary | ICD-10-CM | POA: Diagnosis not present

## 2020-02-26 DIAGNOSIS — G8918 Other acute postprocedural pain: Secondary | ICD-10-CM | POA: Diagnosis not present

## 2020-02-26 DIAGNOSIS — S86011A Strain of right Achilles tendon, initial encounter: Secondary | ICD-10-CM | POA: Diagnosis not present

## 2020-02-26 DIAGNOSIS — Y9367 Activity, basketball: Secondary | ICD-10-CM | POA: Diagnosis not present

## 2020-02-26 DIAGNOSIS — Y998 Other external cause status: Secondary | ICD-10-CM | POA: Diagnosis not present

## 2020-02-26 DIAGNOSIS — X58XXXA Exposure to other specified factors, initial encounter: Secondary | ICD-10-CM | POA: Diagnosis not present

## 2020-02-27 ENCOUNTER — Other Ambulatory Visit: Payer: Self-pay | Admitting: Internal Medicine

## 2020-02-27 MED ORDER — PREGABALIN 75 MG PO CAPS: ORAL_CAPSULE | ORAL | 1 refills | Status: DC

## 2020-03-12 DIAGNOSIS — S86011D Strain of right Achilles tendon, subsequent encounter: Secondary | ICD-10-CM | POA: Diagnosis not present

## 2020-04-29 ENCOUNTER — Other Ambulatory Visit: Payer: Self-pay | Admitting: Internal Medicine

## 2020-06-17 ENCOUNTER — Ambulatory Visit (HOSPITAL_COMMUNITY)
Admission: RE | Admit: 2020-06-17 | Discharge: 2020-06-17 | Disposition: A | Discharge status: Home / Self Care | Payer: PPO | Source: Ambulatory Visit | Attending: Pulmonary Disease | Admitting: Pulmonary Disease

## 2020-06-17 ENCOUNTER — Other Ambulatory Visit: Payer: Self-pay | Admitting: Family

## 2020-06-17 ENCOUNTER — Telehealth: Payer: Self-pay | Admitting: Family

## 2020-06-17 DIAGNOSIS — U071 COVID-19: Secondary | ICD-10-CM | POA: Diagnosis present

## 2020-06-17 DIAGNOSIS — Z23 Encounter for immunization: Secondary | ICD-10-CM | POA: Diagnosis not present

## 2020-06-17 MED ORDER — ACETAMINOPHEN 325 MG PO TABS
650.0000 mg | ORAL_TABLET | Freq: Four times a day (QID) | ORAL | Status: AC | PRN
  Administered 2020-06-17: 650 mg via ORAL
  Filled 2020-06-17: qty 2

## 2020-06-17 MED ORDER — METHYLPREDNISOLONE SODIUM SUCC 125 MG IJ SOLR: 125.0000 mg | Freq: Once | INTRAMUSCULAR | Status: DC | PRN

## 2020-06-17 MED ORDER — SODIUM CHLORIDE 0.9 % IV SOLN: INTRAVENOUS | Status: DC | PRN

## 2020-06-17 MED ORDER — FAMOTIDINE IN NACL 20-0.9 MG/50ML-% IV SOLN: 20.0000 mg | Freq: Once | INTRAVENOUS | Status: DC | PRN

## 2020-06-17 MED ORDER — ALBUTEROL SULFATE HFA 108 (90 BASE) MCG/ACT IN AERS: 2.0000 | INHALATION_SPRAY | Freq: Once | RESPIRATORY_TRACT | Status: DC | PRN

## 2020-06-17 MED ORDER — DIPHENHYDRAMINE HCL 50 MG/ML IJ SOLN: 50.0000 mg | Freq: Once | INTRAMUSCULAR | Status: DC | PRN

## 2020-06-17 MED ORDER — SOTROVIMAB 500 MG/8ML IV SOLN
500.0000 mg | Freq: Once | INTRAVENOUS | Status: AC
  Administered 2020-06-17: 500 mg via INTRAVENOUS

## 2020-06-17 MED ORDER — EPINEPHRINE 0.3 MG/0.3ML IJ SOAJ: 0.3000 mg | Freq: Once | INTRAMUSCULAR | Status: DC | PRN

## 2020-06-17 NOTE — Telephone Encounter (Signed)
Called to Discuss with patient about Covid symptoms and the use of the monoclonal antibody infusion for those with mild to moderate Covid symptoms and at a high risk of hospitalization.     Pt appears to qualify for this infusion due to co-morbid conditions and/or a member of an at-risk group in accordance with the FDA Emergency Use Authorization. (Age, BMI >25)   Unable to reach pt. Left VM requesting call back.   Loel Dubonnet, NP

## 2020-06-17 NOTE — Discharge Instructions (Signed)

## 2020-06-17 NOTE — Telephone Encounter (Signed)
I connected by phone with Frank Hunt on 06/17/2020 at 11:16 AM to discuss the potential use of a new treatment for mild to moderate COVID-19 viral infection in non-hospitalized patients.  This patient is a 64 y.o. male that meets the FDA criteria for Emergency Use Authorization of COVID monoclonal antibody casirivimab/imdevimab, bamlanivimab/eteseviamb, or sotrovimab.  Has a (+) direct SARS-CoV-2 viral test result  Has mild or moderate COVID-19   Is NOT hospitalized due to COVID-19  Is within 10 days of symptom onset  Has at least one of the high risk factor(s) for progression to severe COVID-19 and/or hospitalization as defined in EUA.  Specific high risk criteria : Older age (>/= 66 yo)  Symptoms 06/12/20 Positive 06/16/20  I have spoken and communicated the following to the patient or parent/caregiver regarding COVID monoclonal antibody treatment:  1. FDA has authorized the emergency use for the treatment of mild to moderate COVID-19 in adults and pediatric patients with positive results of direct SARS-CoV-2 viral testing who are 9 years of age and older weighing at least 40 kg, and who are at high risk for progressing to severe COVID-19 and/or hospitalization.  2. The significant known and potential risks and benefits of COVID monoclonal antibody, and the extent to which such potential risks and benefits are unknown.  3. Information on available alternative treatments and the risks and benefits of those alternatives, including clinical trials.  4. Patients treated with COVID monoclonal antibody should continue to self-isolate and use infection control measures (e.g., wear mask, isolate, social distance, avoid sharing personal items, clean and disinfect "high touch" surfaces, and frequent handwashing) according to CDC guidelines.   5. The patient or parent/caregiver has the option to accept or refuse COVID monoclonal antibody treatment.  After reviewing this information with the  patient, the patient has agreed to receive one of the available covid 19 monoclonal antibodies and will be provided an appropriate fact sheet prior to infusion. Loel Dubonnet, NP 06/17/2020 11:16 AM

## 2020-06-17 NOTE — Progress Notes (Signed)
Diagnosis: COVID-19  Physician: Dr. Patrick Wright  Procedure: [facility] Med: Sotrovimab infusion - Provided patient with sotrovimab fact sheet for patients, parents, and caregivers prior to infusion.   Complications: No immediate complications noted  Discharge: Discharged home    

## 2020-06-17 NOTE — Progress Notes (Signed)
Patient reviewed Fact Sheet for Patients, Parents, and Caregivers for Emergency Use Authorization (EUA) of Sotrovimab for the Treatment of Coronavirus. Patient also reviewed and is agreeable to the estimated cost of treatment. Patient is agreeable to proceed.   

## 2020-10-28 ENCOUNTER — Ambulatory Visit: Payer: PPO | Admitting: Adult Health Nurse Practitioner

## 2020-11-05 DIAGNOSIS — Z6828 Body mass index (BMI) 28.0-28.9, adult: Secondary | ICD-10-CM | POA: Diagnosis not present

## 2020-11-05 DIAGNOSIS — R030 Elevated blood-pressure reading, without diagnosis of hypertension: Secondary | ICD-10-CM | POA: Diagnosis not present

## 2020-11-05 DIAGNOSIS — M48062 Spinal stenosis, lumbar region with neurogenic claudication: Secondary | ICD-10-CM | POA: Diagnosis not present

## 2020-11-14 ENCOUNTER — Other Ambulatory Visit: Payer: Self-pay | Admitting: Internal Medicine

## 2021-01-09 ENCOUNTER — Other Ambulatory Visit: Payer: Self-pay | Admitting: Internal Medicine

## 2021-01-09 DIAGNOSIS — F419 Anxiety disorder, unspecified: Secondary | ICD-10-CM

## 2021-01-09 MED ORDER — ALPRAZOLAM 1 MG PO TABS: ORAL_TABLET | ORAL | 0 refills | Status: DC

## 2021-01-30 ENCOUNTER — Encounter: Payer: Self-pay | Admitting: Internal Medicine

## 2021-01-30 ENCOUNTER — Other Ambulatory Visit (INDEPENDENT_AMBULATORY_CARE_PROVIDER_SITE_OTHER): Payer: PPO | Admitting: Internal Medicine

## 2021-01-30 ENCOUNTER — Other Ambulatory Visit: Payer: Self-pay | Admitting: Internal Medicine

## 2021-01-30 DIAGNOSIS — D485 Neoplasm of uncertain behavior of skin: Secondary | ICD-10-CM | POA: Diagnosis not present

## 2021-01-30 DIAGNOSIS — R0989 Other specified symptoms and signs involving the circulatory and respiratory systems: Secondary | ICD-10-CM | POA: Diagnosis not present

## 2021-01-30 DIAGNOSIS — D0471 Carcinoma in situ of skin of right lower limb, including hip: Secondary | ICD-10-CM | POA: Diagnosis not present

## 2021-01-30 DIAGNOSIS — C44629 Squamous cell carcinoma of skin of left upper limb, including shoulder: Secondary | ICD-10-CM | POA: Diagnosis not present

## 2021-01-30 NOTE — Progress Notes (Signed)
    Future Appointments  Date Time Provider [place]  10/28/2021 11:00 AM Frank Comber, NP [facility] None    History of Present Illness:            Patient is a very nice 65 yo MWM with hx/o of ADD off treatment who presents for check up.  Systems review is generally negative but vital sign check finds elevated BP 144/94 by wall arm cuff.  Patient denies any c/o headache , dizziness , CP, palpitations  or dyspnea.   He also has concerns re" scaly 1 superficially ulcerated skin lesions of his dorsal Lt wrist and   Medications    tadalafil 20 MG tablet, TAKE 1/2 TO 1 TABLET EVERY 2 TO 3 DAYS  AS NEEDED   aspirin EC 81 MG tablet, Take daily. ( 3 x/week)     ALPRAZolam 1 MG, Take  1/2 to 1 tab   2 to 3 x /day   as needed for Anxiety ( currently not taking)   ADDERALL 20 MG, Take 1 tablet   1 to 2 x /day as needed for ADD ( currently not taking)  Problem list He has ADD (attention deficit disorder); Mixed hyperlipidemia; Prediabetes; Vitamin D deficiency; and Low back pain with right-sided sciatica on their problem list.   Observations/Objective:  BP (!) 144/94   Pulse 95   Temp 98 F (36.7 C)   Resp 12   Ht 5\' 11"  (1.803 m)   Wt 196 lb (88.9 kg)   BMI 27.34 kg/m   HEENT - WNL. Neck - supple.  Chest - Clear equal BS. Cor - Nl HS. RRR w/o sig MGR. PP 1(+). No edema. MS- FROM w/o deformities.  Gait Nl. Neuro -  Nl w/o focal abnormalities. several superficial and sl raised scaly psoriaform lesions of the dorsal left wrist and right anterior thigh.   Procedure  (CPT : 27741 x 2 )      After informed consent and aseptic prep the areas in question were anesthetized with 0.5 ml Marcaine 0.5% each and removed by excisional technique with a #10 scalpel.  Then the were closed with # 4 vertical mattress sutures of Nylon 3-0.  Antibiotic ung and Tegaderm dressings applied and secures with 2" ace wraps as pressure dressings.   Assessment and Plan:   1. Labile  hypertension  - patient advised to monitor BP's & pulse bid til return OV 1 week   2. 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 Neoplasm of uncertain behavior of skin of thigh  - Surgical pathology   Follow Up Instructions:        I discussed the assessment and treatment plan with the patient. The patient was provided an opportunity to ask questions and all were answered. The patient agreed with the plan and demonstrated an understanding of the instructions.       The patient was advised to call back or seek an in-person evaluation if the symptoms worsen or if the condition fails to improve as anticipated.    Kirtland Bouchard, MD

## 2021-02-02 ENCOUNTER — Other Ambulatory Visit: Payer: Self-pay | Admitting: Internal Medicine

## 2021-02-02 DIAGNOSIS — R0989 Other specified symptoms and signs involving the circulatory and respiratory systems: Secondary | ICD-10-CM

## 2021-02-02 MED ORDER — BISOPROLOL FUMARATE 5 MG PO TABS: ORAL_TABLET | ORAL | 3 refills | Status: DC

## 2021-02-05 NOTE — Progress Notes (Addendum)
    Future Appointments  Date Time Provider [place]  02/06/2021 11:30 AM Unk Pinto, MD [facility] None  10/28/2021 - [facility]  11:00 AM [name], NP [facility] None    History of Present Illness:       This very nice 65 yo MWM  with HTN, HLD, ADD, Testosterone Deficiency, Vit D Deficiency  returns for f/u of elevated BP.    In 2018 Vit D was low at "33".  Hx/o Low T "326" in 2015,   Monitored expectantly for abnormal lipids    Medications   Current Outpatient Medications (Cardiovascular):    bisoprolol (ZEBETA) 5 MG tablet, Take  1/2 to 1 tablet  every Morning  for BP   tadalafil (CIALIS) 20 MG tablet, TAKE 1/2 TO 1 TABLET BY MOUTH EVERY 2 TO 3 DAYS  AS NEEDED   Current Outpatient Medications (Analgesics):    aspirin EC 81 MG tablet, Take 81 mg by mouth daily.   Current Outpatient Medications (Other):    ALPRAZolam (XANAX) 1 MG tablet, Take  1/2 to 1 tab   2 to 3 x /day   as needed for Anxiety   amphetamine-dextroamphetamine (ADDERALL) 20 MG tablet, Take 1 tablet   1 to 2 x /day as needed for ADD  Problem list He has ADD (attention deficit disorder); Mixed hyperlipidemia; Prediabetes; Vitamin D deficiency; and Low back pain with right-sided sciatica on their problem list.   Observations/Objective:  BP 140/80   Pulse 80   Temp (!) 97 F (36.1 C)   Resp 12   HEENT - WNL. Neck - supple.  Chest - Clear equal BS. Cor - Nl HS. RRR w/o sig MGR. PP 1(+). No edema. MS- FROM w/o deformities.  Gait Nl. Neuro -  Nl w/o focal abnormalities.   Assessment and Plan:  1. Labile hypertension  - Urinalysis, Routine w reflex microscopic - Iron, Total/Total Iron Binding Cap - CBC with Differential/Platelet - COMPLETE METABOLIC PANEL WITH GFR - Magnesium - TSH - Microalbumin / creatinine urine ratio  2. Hyperlipidemia, mixed  - Lipid panel - TSH  3. Abnormal glucose  - Magnesium - Hemoglobin A1c - Insulin, random  4. Vitamin D  deficiency  - VITAMIN D 25 Hydroxy (Vit-D Deficiency, Fractures)  5. Testosterone deficiency  - Testosterone  6. Fatigue, unspecified type  - CBC with Differential/Platelet - TSH  7. Screening PSA (prostate specific antigen)  - PSA  8. BPH with obstruction/lower urinary tract symptoms  - PSA  9. Medication management  - Insulin, random - VITAMIN D 25 Hydroxy - Cologuard - Microalbumin / creatinine urine ratio    Follow Up Instructions:        I discussed the assessment and treatment plan with the patient. The patient was provided an opportunity to ask questions and all were answered. The patient agreed with the plan and demonstrated an understanding of the instructions.       The patient was advised to call back or seek an in-person evaluation if the symptoms worsen or if the condition fails to improve as anticipated.    Kirtland Bouchard, MD

## 2021-02-06 ENCOUNTER — Ambulatory Visit (INDEPENDENT_AMBULATORY_CARE_PROVIDER_SITE_OTHER): Payer: PPO | Admitting: Internal Medicine

## 2021-02-06 ENCOUNTER — Other Ambulatory Visit: Payer: Self-pay

## 2021-02-06 VITALS — BP 140/80 | HR 80 | Temp 97.0°F | Resp 12

## 2021-02-06 DIAGNOSIS — Z125 Encounter for screening for malignant neoplasm of prostate: Secondary | ICD-10-CM | POA: Diagnosis not present

## 2021-02-06 DIAGNOSIS — R7303 Prediabetes: Secondary | ICD-10-CM

## 2021-02-06 DIAGNOSIS — R5383 Other fatigue: Secondary | ICD-10-CM | POA: Diagnosis not present

## 2021-02-06 DIAGNOSIS — R7309 Other abnormal glucose: Secondary | ICD-10-CM

## 2021-02-06 DIAGNOSIS — N401 Enlarged prostate with lower urinary tract symptoms: Secondary | ICD-10-CM

## 2021-02-06 DIAGNOSIS — E782 Mixed hyperlipidemia: Secondary | ICD-10-CM

## 2021-02-06 DIAGNOSIS — Z79899 Other long term (current) drug therapy: Secondary | ICD-10-CM

## 2021-02-06 DIAGNOSIS — R0989 Other specified symptoms and signs involving the circulatory and respiratory systems: Secondary | ICD-10-CM

## 2021-02-06 DIAGNOSIS — N138 Other obstructive and reflux uropathy: Secondary | ICD-10-CM | POA: Diagnosis not present

## 2021-02-06 DIAGNOSIS — E349 Endocrine disorder, unspecified: Secondary | ICD-10-CM

## 2021-02-06 DIAGNOSIS — E559 Vitamin D deficiency, unspecified: Secondary | ICD-10-CM | POA: Diagnosis not present

## 2021-02-08 ENCOUNTER — Encounter: Payer: Self-pay | Admitting: Internal Medicine

## 2021-02-08 NOTE — Progress Notes (Signed)
============================================================ ============================================================  -    PSA - Very Low - Great   ============================================================ ============================================================  -  Testosterone elevated  from recent shot ============================================================  - Kidney functions look a little dehydrated    Very important to drink adequate amounts of fluids to prevent permanent damage    - Recommend drink at least 6 bottles (16 ounces) of fluids /water /day = 96 Oz ~100 oz  - 100 oz = 3,000 cc or 3 liters / day  - >> That's 1 &1/2 bottles of a 2 liter soda bottle /day !  ============================================================ ============================================================  -  Total Cho l= 173   and LD:L Chol 98  - Both   Excellent   - Very low risk for Heart Attack  / Stroke ============================================================ ============================================================  - A1c - Normal - Great - No Diabetes  ============================================================ ============================================================  - Vitamin D = 80 - Excellent  ! ============================================================ ============================================================  - All Else - CBC - Kidneys - Electrolytes - Liver - Magnesium & Thyroid    - all  Normal / OK ============================================================ ============================================================  -  Keep up the [place] Work   !  ============================================================ ============================================================

## 2021-02-09 LAB — PSA: PSA: 0.63 ng/mL (ref <=4.00)

## 2021-02-09 LAB — HEMOGLOBIN A1C
Hgb A1c MFr Bld: 5.3 % of total Hgb (ref <5.7)
Mean Plasma Glucose: 105 mg/dL
eAG (mmol/L): 5.8 mmol/L

## 2021-02-09 LAB — URINALYSIS, ROUTINE W REFLEX MICROSCOPIC
Bacteria, UA: NONE SEEN /HPF
Bilirubin Urine: NEGATIVE
Glucose, UA: NEGATIVE
Hyaline Cast: NONE SEEN /LPF
Leukocytes,Ua: NEGATIVE
Nitrite: NEGATIVE
Protein, ur: NEGATIVE
Specific Gravity, Urine: 1.021 (ref 1.001–1.035)
Squamous Epithelial / HPF: NONE SEEN /HPF (ref <=5)
WBC, UA: NONE SEEN /HPF (ref 0–5)
pH: 6 (ref 5.0–8.0)

## 2021-02-09 LAB — INSULIN, RANDOM: Insulin: 2.8 u[IU]/mL

## 2021-02-09 LAB — COMPLETE METABOLIC PANEL WITH GFR
AG Ratio: 1.5 (calc) (ref 1.0–2.5)
ALT: 28 U/L (ref 9–46)
AST: 26 U/L (ref 10–35)
Albumin: 4.3 g/dL (ref 3.6–5.1)
Alkaline phosphatase (APISO): 49 U/L (ref 35–144)
BUN: 12 mg/dL (ref 7–25)
CO2: 27 mmol/L (ref 20–32)
Calcium: 9.2 mg/dL (ref 8.6–10.3)
Chloride: 104 mmol/L (ref 98–110)
Creat: 1.35 mg/dL (ref 0.70–1.35)
Globulin: 2.8 g/dL (calc) (ref 1.9–3.7)
Glucose, Bld: 94 mg/dL (ref 65–99)
Potassium: 4.6 mmol/L (ref 3.5–5.3)
Sodium: 139 mmol/L (ref 135–146)
Total Bilirubin: 0.9 mg/dL (ref 0.2–1.2)
Total Protein: 7.1 g/dL (ref 6.1–8.1)
eGFR: 58 mL/min/{1.73_m2} — ABNORMAL LOW (ref >=60)

## 2021-02-09 LAB — CBC WITH DIFFERENTIAL/PLATELET
Absolute Monocytes: 521 cells/uL (ref 200–950)
Basophils Absolute: 50 cells/uL (ref 0–200)
Basophils Relative: 0.9 %
Eosinophils Absolute: 101 cells/uL (ref 15–500)
Eosinophils Relative: 1.8 %
HCT: 51.9 % — ABNORMAL HIGH (ref 38.5–50.0)
Hemoglobin: 16.9 g/dL (ref 13.2–17.1)
Lymphs Abs: 1478 cells/uL (ref 850–3900)
MCH: 28.3 pg (ref 27.0–33.0)
MCHC: 32.6 g/dL (ref 32.0–36.0)
MCV: 86.8 fL (ref 80.0–100.0)
MPV: 9.4 fL (ref 7.5–12.5)
Monocytes Relative: 9.3 %
Neutro Abs: 3450 cells/uL (ref 1500–7800)
Neutrophils Relative %: 61.6 %
Platelets: 227 10*3/uL (ref 140–400)
RBC: 5.98 10*6/uL — ABNORMAL HIGH (ref 4.20–5.80)
RDW: 14.1 % (ref 11.0–15.0)
Total Lymphocyte: 26.4 %
WBC: 5.6 10*3/uL (ref 3.8–10.8)

## 2021-02-09 LAB — MICROALBUMIN / CREATININE URINE RATIO
Creatinine, Urine: 290 mg/dL (ref 20–320)
Microalb Creat Ratio: 3 mcg/mg creat (ref <30)
Microalb, Ur: 0.9 mg/dL

## 2021-02-09 LAB — LIPID PANEL
Cholesterol: 173 mg/dL (ref <200)
HDL: 58 mg/dL (ref >=40)
LDL Cholesterol (Calc): 98 mg/dL (calc)
Non-HDL Cholesterol (Calc): 115 mg/dL (calc) (ref <130)
Total CHOL/HDL Ratio: 3 (calc) (ref <5.0)
Triglycerides: 82 mg/dL (ref <150)

## 2021-02-09 LAB — MAGNESIUM: Magnesium: 2 mg/dL (ref 1.5–2.5)

## 2021-02-09 LAB — TESTOSTERONE: Testosterone: >3000 ng/dL — ABNORMAL HIGH (ref 250–827)

## 2021-02-09 LAB — MICROSCOPIC MESSAGE

## 2021-02-09 LAB — IRON, TOTAL/TOTAL IRON BINDING CAP
%SAT: 44 % (calc) (ref 20–48)
Iron: 165 ug/dL (ref 50–180)
TIBC: 374 mcg/dL (calc) (ref 250–425)

## 2021-02-09 LAB — TSH: TSH: 2.92 mIU/L (ref 0.40–4.50)

## 2021-02-09 LAB — VITAMIN D 25 HYDROXY (VIT D DEFICIENCY, FRACTURES): Vit D, 25-Hydroxy: 80 ng/mL (ref 30–100)

## 2021-02-11 NOTE — Progress Notes (Signed)
-   Path finally returned. Both Lt wrist  & Rt thigh were skin cancers.   - The Left wrist had clear margins    The Rt anterior thigh  had a margin involved.  - So need a wider excision for the Rt thigh .

## 2021-02-19 NOTE — Addendum Note (Signed)
Addended by: Unk Pinto on: 02/19/2021 10:45 PM   Modules accepted: Orders

## 2021-02-24 ENCOUNTER — Other Ambulatory Visit: Payer: Self-pay

## 2021-02-24 ENCOUNTER — Other Ambulatory Visit: Payer: PPO

## 2021-02-24 ENCOUNTER — Other Ambulatory Visit: Payer: Self-pay | Admitting: Internal Medicine

## 2021-02-24 DIAGNOSIS — E349 Endocrine disorder, unspecified: Secondary | ICD-10-CM

## 2021-02-24 MED ORDER — SILDENAFIL CITRATE 100 MG PO TABS: ORAL_TABLET | ORAL | 0 refills | Status: DC

## 2021-02-25 ENCOUNTER — Other Ambulatory Visit: Payer: Self-pay | Admitting: Internal Medicine

## 2021-02-25 DIAGNOSIS — D497 Neoplasm of unspecified behavior of endocrine glands and other parts of nervous system: Secondary | ICD-10-CM

## 2021-02-25 LAB — TESTOSTERONE: Testosterone: >3000 ng/dL — ABNORMAL HIGH (ref 250–827)

## 2021-02-26 ENCOUNTER — Ambulatory Visit (HOSPITAL_BASED_OUTPATIENT_CLINIC_OR_DEPARTMENT_OTHER)
Admission: RE | Admit: 2021-02-26 | Discharge: 2021-02-26 | Disposition: A | Discharge status: Home / Self Care | Payer: PPO | Source: Ambulatory Visit | Attending: Internal Medicine | Admitting: Internal Medicine

## 2021-02-26 ENCOUNTER — Other Ambulatory Visit: Payer: Self-pay

## 2021-02-26 DIAGNOSIS — D497 Neoplasm of unspecified behavior of endocrine glands and other parts of nervous system: Secondary | ICD-10-CM | POA: Diagnosis not present

## 2021-02-26 DIAGNOSIS — I700 Atherosclerosis of aorta: Secondary | ICD-10-CM | POA: Insufficient documentation

## 2021-02-26 DIAGNOSIS — R1909 Other intra-abdominal and pelvic swelling, mass and lump: Secondary | ICD-10-CM | POA: Diagnosis not present

## 2021-02-26 DIAGNOSIS — N281 Cyst of kidney, acquired: Secondary | ICD-10-CM | POA: Diagnosis not present

## 2021-02-26 MED ORDER — IOHEXOL 300 MG/ML  SOLN
80.0000 mL | Freq: Once | INTRAMUSCULAR | Status: AC | PRN
  Administered 2021-02-26: 80 mL via INTRAVENOUS

## 2021-02-27 ENCOUNTER — Telehealth: Payer: Self-pay | Admitting: Internal Medicine

## 2021-02-27 NOTE — Telephone Encounter (Signed)
I had received a message to contact PCP regarding questions about elevated testosterone level.  Discussed the case with Dr.McKeown on 02/27/2021 about testosterone level of over 3000  Per history patient is on OTC supplements, used to be on testosterone injections but has not had any in a while.    He is off OTC supplements He will have his testosterone and LH checked in 4 weeks  CT scan unrevealing   If testosterone continues to be elevated I would suggest proceeding with urinary steroid testing prior to proceeding with any further evaluation

## 2021-02-28 ENCOUNTER — Other Ambulatory Visit: Payer: Self-pay | Admitting: Internal Medicine

## 2021-02-28 DIAGNOSIS — E349 Endocrine disorder, unspecified: Secondary | ICD-10-CM

## 2021-02-28 DIAGNOSIS — R7989 Other specified abnormal findings of blood chemistry: Secondary | ICD-10-CM

## 2021-02-28 NOTE — Progress Notes (Signed)
-   CT scan - Normal - Great   - Recommend repeat labs in 3-4 weeks

## 2021-03-10 ENCOUNTER — Other Ambulatory Visit: Payer: PPO

## 2021-03-13 ENCOUNTER — Other Ambulatory Visit: Payer: Self-pay

## 2021-03-13 ENCOUNTER — Other Ambulatory Visit: Payer: Self-pay | Admitting: Internal Medicine

## 2021-03-13 ENCOUNTER — Other Ambulatory Visit: Payer: PPO

## 2021-03-13 DIAGNOSIS — N179 Acute kidney failure, unspecified: Secondary | ICD-10-CM

## 2021-03-13 DIAGNOSIS — E349 Endocrine disorder, unspecified: Secondary | ICD-10-CM

## 2021-03-13 DIAGNOSIS — Z79899 Other long term (current) drug therapy: Secondary | ICD-10-CM

## 2021-03-13 DIAGNOSIS — R7989 Other specified abnormal findings of blood chemistry: Secondary | ICD-10-CM

## 2021-03-14 LAB — COMPLETE METABOLIC PANEL WITH GFR
AG Ratio: 1.6 (calc) (ref 1.0–2.5)
ALT: 28 U/L (ref 9–46)
AST: 20 U/L (ref 10–35)
Albumin: 4 g/dL (ref 3.6–5.1)
Alkaline phosphatase (APISO): 47 U/L (ref 35–144)
BUN: 15 mg/dL (ref 7–25)
CO2: 29 mmol/L (ref 20–32)
Calcium: 8.8 mg/dL (ref 8.6–10.3)
Chloride: 104 mmol/L (ref 98–110)
Creat: 1.18 mg/dL (ref 0.70–1.35)
Globulin: 2.5 g/dL (calc) (ref 1.9–3.7)
Glucose, Bld: 92 mg/dL (ref 65–99)
Potassium: 4.4 mmol/L (ref 3.5–5.3)
Sodium: 139 mmol/L (ref 135–146)
Total Bilirubin: 0.9 mg/dL (ref 0.2–1.2)
Total Protein: 6.5 g/dL (ref 6.1–8.1)
eGFR: 68 mL/min/{1.73_m2} (ref >=60)

## 2021-03-14 LAB — TESTOSTERONE: Testosterone: 1490 ng/dL — ABNORMAL HIGH (ref 250–827)

## 2021-03-14 LAB — LUTEINIZING HORMONE: LH: <0.2 m[IU]/mL — ABNORMAL LOW (ref 1.6–15.2)

## 2021-03-16 NOTE — Progress Notes (Signed)
~~~~~~~~~~~~~~~~~~~~~~~~~~~~~~~~~~~~~~~~~~~~~~~~~~~~~~~~~~~~ ~~~~~~~~~~~~~~~~~~~~~~~~~~~~~~~~~~~~~~~~~~~~~~~~~~~~~~~~~~~~  -   Testosterone level is down about 1/2 of elevated value 2 weeks ago &  Is still too high   - Etiology remains obscure as whether this is a residual effect of                                                                  the OTC supplement " Male Extra "   - LH level is suppressed - > very low as a result of the high testosterone levels   - Suggest repeat testosterone level in about 2 weeks (betw Sept 5 - 9 )   ~~~~~~~~~~~~~~~~~~~~~~~~~~~~~~~~~~~~~~~~~~~~~~~~~~~~~~~~~~~~ ~~~~~~~~~~~~~~~~~~~~~~~~~~~~~~~~~~~~~~~~~~~~~~~~~~~~~~~~~~~~

## 2021-03-27 ENCOUNTER — Other Ambulatory Visit: Payer: Self-pay | Admitting: Internal Medicine

## 2021-03-27 ENCOUNTER — Other Ambulatory Visit: Payer: PPO

## 2021-03-27 ENCOUNTER — Other Ambulatory Visit: Payer: Self-pay

## 2021-03-27 DIAGNOSIS — R7989 Other specified abnormal findings of blood chemistry: Secondary | ICD-10-CM

## 2021-03-27 DIAGNOSIS — E349 Endocrine disorder, unspecified: Secondary | ICD-10-CM

## 2021-03-28 LAB — TESTOSTERONE: Testosterone: 1058 ng/dL — ABNORMAL HIGH (ref 250–827)

## 2021-03-29 ENCOUNTER — Other Ambulatory Visit: Payer: Self-pay | Admitting: Internal Medicine

## 2021-03-29 DIAGNOSIS — R7989 Other specified abnormal findings of blood chemistry: Secondary | ICD-10-CM

## 2021-03-29 DIAGNOSIS — E349 Endocrine disorder, unspecified: Secondary | ICD-10-CM

## 2021-03-29 NOTE — Progress Notes (Signed)
============================================================ -   Test results slightly outside the reference range are not unusual. If there is anything important, I will review this with you,  otherwise it is considered normal test values.  If you have further questions,  please do not hesitate to contact me at the office or via My Chart.  ============================================================ ============================================================  -  Testosterone still abnormally elevated over 6 weeks after                                                                                   stopping the Supplement    - Recommending waiting 1 month  - about the 1st week of October to recheck Testosterone  ============================================================ ============================================================

## 2021-04-07 ENCOUNTER — Other Ambulatory Visit: Payer: Self-pay | Admitting: Internal Medicine

## 2021-04-07 DIAGNOSIS — E349 Endocrine disorder, unspecified: Secondary | ICD-10-CM

## 2021-04-24 ENCOUNTER — Other Ambulatory Visit: Payer: Self-pay

## 2021-04-24 ENCOUNTER — Other Ambulatory Visit: Payer: Self-pay | Admitting: Internal Medicine

## 2021-04-24 ENCOUNTER — Ambulatory Visit (INDEPENDENT_AMBULATORY_CARE_PROVIDER_SITE_OTHER): Payer: PPO | Admitting: Internal Medicine

## 2021-04-24 VITALS — BP 124/84 | HR 60 | Temp 98.0°F | Resp 12 | Ht 71.0 in | Wt 196.0 lb

## 2021-04-24 DIAGNOSIS — R7989 Other specified abnormal findings of blood chemistry: Secondary | ICD-10-CM | POA: Diagnosis not present

## 2021-04-24 DIAGNOSIS — Z79899 Other long term (current) drug therapy: Secondary | ICD-10-CM

## 2021-04-24 DIAGNOSIS — C44622 Squamous cell carcinoma of skin of right upper limb, including shoulder: Secondary | ICD-10-CM | POA: Diagnosis not present

## 2021-04-24 DIAGNOSIS — E349 Endocrine disorder, unspecified: Secondary | ICD-10-CM | POA: Diagnosis not present

## 2021-04-24 DIAGNOSIS — E782 Mixed hyperlipidemia: Secondary | ICD-10-CM | POA: Diagnosis not present

## 2021-04-24 DIAGNOSIS — I10 Essential (primary) hypertension: Secondary | ICD-10-CM | POA: Diagnosis not present

## 2021-04-24 DIAGNOSIS — R0989 Other specified symptoms and signs involving the circulatory and respiratory systems: Secondary | ICD-10-CM

## 2021-04-24 DIAGNOSIS — N179 Acute kidney failure, unspecified: Secondary | ICD-10-CM

## 2021-04-24 DIAGNOSIS — D485 Neoplasm of uncertain behavior of skin: Secondary | ICD-10-CM

## 2021-04-24 NOTE — Progress Notes (Signed)
Future Appointments  Date Time Provider [place]  10/28/2021 11:00 AM [name], NP [facility] None    History of Present Illness:     Patient is a nice 65 yo mWM recently dx'd with HTN and reporting fatigue on low dose Bisoprolol ( takes 1/2 tab of  5mg  ). Also was discovered elevated lipids and is on a strict diet .  Other problem was extremely elevated Testosterone level  felt related to OTC  male enhancement product  knlwn as "Male extra". LH level was suppressed and Abd CT scan found no abnormalities of the Adrenal glands.      On July 8, patient had a SSC excised on dorsal Left wrist with clear margins, but excised lesion of anterior mid Right thigh had a margin involved.  He also has an crusting ulcerated lesion of the dorsal mid Rt forearm.    Medications   Current Outpatient Medications (Cardiovascular):    bisoprolol (ZEBETA) 5 MG tablet, Take  1/2 to 1 tablet  every Morning  for BP   sildenafil (VIAGRA) 100 MG tablet, TAKE 1 TABLET  DAILY AS NEEDED FOR XXXX   Current Outpatient Medications (Analgesics):    aspirin EC 81 MG tablet, Take 81 mg by mouth daily.   Current Outpatient Medications (Other):    ALPRAZolam (XANAX) 1 MG tablet, Take  1/2 to 1 tab   2 to 3 x /day   as needed for Anxiety   amphetamine-dextroamphetamine (ADDERALL) 20 MG tablet, Take 1 tablet   1 to 2 x /day as needed for ADD  Problem list He has ADD (attention deficit disorder); Mixed hyperlipidemia; Prediabetes; Vitamin D deficiency; and Low back pain with right-sided sciatica on their problem list.   Observations/Objective:  There were no vitals taken for this visit.  HEENT - WNL. Neck - supple.  Chest - Clear. Cor - Nl HS. RRR w/o sig M. No edema. MS- FROM w/o deformities.  Gait Nl. Neuro -  Nl w/o focal abnormalities. Skin -  1st Area s/p  excision of Right anterior thigh  SCC appears well healed &  patient prefers watchful observation before re-excision.  2sd lesion of  dorsal Rt mid forearm finds a 10 mm crusting dry superficially ulcerated pink lesion.   Procedure   ( CPT:   76734  )      After informed consent and aseptic prep with alcohol , 3 ml of Marcaine 0.5% were infiltrated intradermal & sq around the lesion. Then with a #10 scalpel an elipitical incision was made approx 30 mm to completely excise the lesion full thickness. Then wound edges were undermined  and then aligned & approximated. Next wound edges were effaced, everted in alignment & secured with # 6 vertical mattress sutures of Nylon 3-0. Antibiotic ointment an sterile  3" x 3" Tegaderm type occlusive type bandage was  applied over the wound.  Lesion was sent to path for analysis to r/o skin ca.   Assessment and Plan:  1. Labile hypertension  - CBC & CMET rechecked   - Advised hold bisoprolol & monitor BP's & P's over the next several days.   2. Hyperlipidemia, mixed   3. Elevated testosterone level in male  - Testosterone level repeated.  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   Excised  (CPT 11601)   5. Medication management  - CBC & CMET rechecked  - Testosterone level repeated.  Follow Up Instructions:      I discussed the  assessment and treatment plan with the patient. The patient was provided an opportunity to ask questions and all were answered. The patient agreed with the plan and demonstrated an understanding of the instructions.       The patient was advised to call back or seek an in-person evaluation if the symptoms worsen or if the condition fails to improve as anticipated.    Kirtland Bouchard, MD

## 2021-04-25 LAB — CBC WITH DIFFERENTIAL/PLATELET
Absolute Monocytes: 450 cells/uL (ref 200–950)
Basophils Absolute: 50 cells/uL (ref 0–200)
Basophils Relative: 1 %
Eosinophils Absolute: 140 cells/uL (ref 15–500)
Eosinophils Relative: 2.8 %
HCT: 55.3 % — ABNORMAL HIGH (ref 38.5–50.0)
Hemoglobin: 17.8 g/dL — ABNORMAL HIGH (ref 13.2–17.1)
Lymphs Abs: 1370 cells/uL (ref 850–3900)
MCH: 29.2 pg (ref 27.0–33.0)
MCHC: 32.2 g/dL (ref 32.0–36.0)
MCV: 90.8 fL (ref 80.0–100.0)
MPV: 10.3 fL (ref 7.5–12.5)
Monocytes Relative: 9 %
Neutro Abs: 2990 cells/uL (ref 1500–7800)
Neutrophils Relative %: 59.8 %
Platelets: 159 10*3/uL (ref 140–400)
RBC: 6.09 10*6/uL — ABNORMAL HIGH (ref 4.20–5.80)
RDW: 13.5 % (ref 11.0–15.0)
Total Lymphocyte: 27.4 %
WBC: 5 10*3/uL (ref 3.8–10.8)

## 2021-04-25 LAB — COMPLETE METABOLIC PANEL WITH GFR
AG Ratio: 1.6 (calc) (ref 1.0–2.5)
ALT: 37 U/L (ref 9–46)
AST: 31 U/L (ref 10–35)
Albumin: 4.3 g/dL (ref 3.6–5.1)
Alkaline phosphatase (APISO): 56 U/L (ref 35–144)
BUN: 24 mg/dL (ref 7–25)
CO2: 27 mmol/L (ref 20–32)
Calcium: 9.4 mg/dL (ref 8.6–10.3)
Chloride: 103 mmol/L (ref 98–110)
Creat: 1.24 mg/dL (ref 0.70–1.35)
Globulin: 2.7 g/dL (calc) (ref 1.9–3.7)
Glucose, Bld: 99 mg/dL (ref 65–99)
Potassium: 4.4 mmol/L (ref 3.5–5.3)
Sodium: 138 mmol/L (ref 135–146)
Total Bilirubin: 1 mg/dL (ref 0.2–1.2)
Total Protein: 7 g/dL (ref 6.1–8.1)
eGFR: 64 mL/min/{1.73_m2} (ref >=60)

## 2021-04-25 LAB — TESTOSTERONE: Testosterone: 643 ng/dL (ref 250–827)

## 2021-04-25 NOTE — Progress Notes (Signed)
============================================================ ============================================================  -    Testosterone 643 - finally back to Normal   - CBC & CMET - Also Nl  / OK  ============================================================ ============================================================

## 2021-04-28 DIAGNOSIS — I10 Essential (primary) hypertension: Secondary | ICD-10-CM | POA: Insufficient documentation

## 2021-04-28 NOTE — Progress Notes (Signed)
============================================================ ============================================================  -    Excision on Rt forearm also showed a skin cancer, but   Margins are  clear - So won't need to do a re-excision - Great !  ============================================================ ============================================================

## 2021-05-22 ENCOUNTER — Other Ambulatory Visit: Payer: PPO

## 2021-05-22 ENCOUNTER — Other Ambulatory Visit: Payer: Self-pay | Admitting: Internal Medicine

## 2021-05-22 ENCOUNTER — Other Ambulatory Visit: Payer: Self-pay

## 2021-05-22 DIAGNOSIS — I10 Essential (primary) hypertension: Secondary | ICD-10-CM

## 2021-05-22 DIAGNOSIS — D751 Secondary polycythemia: Secondary | ICD-10-CM | POA: Diagnosis not present

## 2021-05-22 DIAGNOSIS — R7989 Other specified abnormal findings of blood chemistry: Secondary | ICD-10-CM

## 2021-05-22 DIAGNOSIS — Z79899 Other long term (current) drug therapy: Secondary | ICD-10-CM | POA: Diagnosis not present

## 2021-05-22 DIAGNOSIS — R5383 Other fatigue: Secondary | ICD-10-CM

## 2021-05-23 NOTE — Progress Notes (Signed)
============================================================ -   Test results slightly outside the reference range are not unusual. If there is anything important, I will review this with you,  otherwise it is considered normal test values.  If you have further questions,  please do not hesitate to contact me at the office or via My Chart.  ============================================================ ============================================================  -  Testosterone level is lower, but still in Normal range ============================================================ ============================================================  -  CBC - OK   -CMET   - Kidney functions look a little dehydrated    Very important to drink adequate amounts of fluids                                                                               to prevent permanent damage    - Recommend drink at least 6 bottles (16 ounces) of fluids /water /day = 96 Oz ~100 oz  - 100 oz = 3,000 cc or 3 liters / day  - >> That's 1 &1/2 bottles of a 2 liter soda bottle /day !  ============================================================ ============================================================  -  liver enzymes are slightly elevated & suggest repeat in about 3 months.  ============================================================ ============================================================

## 2021-05-26 ENCOUNTER — Ambulatory Visit: Payer: PPO | Admitting: Podiatry

## 2021-06-02 ENCOUNTER — Ambulatory Visit: Payer: PPO | Admitting: Podiatry

## 2021-06-02 ENCOUNTER — Other Ambulatory Visit: Payer: Self-pay

## 2021-06-02 ENCOUNTER — Encounter: Payer: Self-pay | Admitting: Podiatry

## 2021-06-02 DIAGNOSIS — L603 Nail dystrophy: Secondary | ICD-10-CM

## 2021-06-02 DIAGNOSIS — L601 Onycholysis: Secondary | ICD-10-CM | POA: Diagnosis not present

## 2021-06-02 DIAGNOSIS — B351 Tinea unguium: Secondary | ICD-10-CM | POA: Diagnosis not present

## 2021-06-02 NOTE — Progress Notes (Signed)
  Subjective:  Patient ID: Frank Hunt, male    DOB: August 30, 1955,  MRN: [id number] HPI Chief Complaint  Patient presents with   Nail Problem    Toenails - thick and dark nails x years, tried tea tree oil and other OTC - no help   New Patient (Initial Visit)    65 y.o. male presents with the above complaint.   ROS: Denies fever chills nausea vomiting muscle aches pains calf pain back pain chest pain shortness of breath.  No past medical history on file. Past Surgical History:  Procedure Laterality Date   CATARACT EXTRACTION W/ INTRAOCULAR LENS IMPLANT Left 03/26/2013   Dr. Herbert Deaner    Current Outpatient Medications:    aspirin EC 81 MG tablet, Take 81 mg by mouth daily., Disp: , Rfl:    bisoprolol (ZEBETA) 5 MG tablet, Take  1/2 to 1 tablet  every Morning  for BP, Disp: 90 tablet, Rfl: 3   sildenafil (VIAGRA) 100 MG tablet, TAKE 1 TABLET  DAILY AS NEEDED FOR XXXX, Disp: 30 tablet, Rfl: 3  No Known Allergies Review of Systems Objective:  There were no vitals filed for this visit.  General: Well developed, nourished, in no acute distress, alert and oriented x3   Dermatological: Skin is warm, dry and supple bilateral. Nails x 10 are well maintained; remaining integument appears unremarkable at this time. There are no open sores, no preulcerative lesions, no rash or signs of infection present.  Vascular: Dorsalis Pedis artery and Posterior Tibial artery pedal pulses are 2/4 bilateral with immedate capillary fill time. Pedal hair growth present. No varicosities and no lower extremity edema present bilateral.   Neruologic: Grossly intact via light touch bilateral. Vibratory intact via tuning fork bilateral. Protective threshold with Semmes Wienstein monofilament intact to all pedal sites bilateral. Patellar and Achilles deep tendon reflexes 2+ bilateral. No Babinski or clonus noted bilateral.   Musculoskeletal: No gross boney pedal deformities bilateral. No pain, crepitus, or limitation  noted with foot and ankle range of motion bilateral. Muscular strength 5/5 in all groups tested bilateral.  Gait: Unassisted, Nonantalgic.    Radiographs:  None taken  Assessment & Plan:   Assessment: Nail dystrophy  Plan: Samples of skin and nail were taken today for pathologic evaluation.  Follow-up with him in 1 month     Alixandrea Milleson T. Banner, [place]

## 2021-06-05 LAB — CBC WITH DIFFERENTIAL/PLATELET
Absolute Monocytes: 459 cells/uL (ref 200–950)
Basophils Absolute: 49 cells/uL (ref 0–200)
Basophils Relative: 1.2 %
Eosinophils Absolute: 131 cells/uL (ref 15–500)
Eosinophils Relative: 3.2 %
HCT: 52.5 % — ABNORMAL HIGH (ref 38.5–50.0)
Hemoglobin: 17.3 g/dL — ABNORMAL HIGH (ref 13.2–17.1)
Lymphs Abs: 1382 cells/uL (ref 850–3900)
MCH: 30.4 pg (ref 27.0–33.0)
MCHC: 33 g/dL (ref 32.0–36.0)
MCV: 92.3 fL (ref 80.0–100.0)
MPV: 9.9 fL (ref 7.5–12.5)
Monocytes Relative: 11.2 %
Neutro Abs: 2079 cells/uL (ref 1500–7800)
Neutrophils Relative %: 50.7 %
Platelets: 209 10*3/uL (ref 140–400)
RBC: 5.69 10*6/uL (ref 4.20–5.80)
RDW: 14.1 % (ref 11.0–15.0)
Total Lymphocyte: 33.7 %
WBC: 4.1 10*3/uL (ref 3.8–10.8)

## 2021-06-05 LAB — COMPLETE METABOLIC PANEL WITH GFR
AG Ratio: 1.7 (calc) (ref 1.0–2.5)
ALT: 66 U/L — ABNORMAL HIGH (ref 9–46)
AST: 40 U/L — ABNORMAL HIGH (ref 10–35)
Albumin: 4.3 g/dL (ref 3.6–5.1)
Alkaline phosphatase (APISO): 54 U/L (ref 35–144)
BUN/Creatinine Ratio: 13 (calc) (ref 6–22)
BUN: 20 mg/dL (ref 7–25)
CO2: 22 mmol/L (ref 20–32)
Calcium: 9.4 mg/dL (ref 8.6–10.3)
Chloride: 103 mmol/L (ref 98–110)
Creat: 1.52 mg/dL — ABNORMAL HIGH (ref 0.70–1.35)
Globulin: 2.6 g/dL (calc) (ref 1.9–3.7)
Glucose, Bld: 97 mg/dL (ref 65–99)
Potassium: 4.3 mmol/L (ref 3.5–5.3)
Sodium: 138 mmol/L (ref 135–146)
Total Bilirubin: 0.6 mg/dL (ref 0.2–1.2)
Total Protein: 6.9 g/dL (ref 6.1–8.1)
eGFR: 50 mL/min/{1.73_m2} — ABNORMAL LOW (ref >=60)

## 2021-06-05 LAB — TESTOSTERONE: Testosterone: 329 ng/dL (ref 250–827)

## 2021-06-05 LAB — HEMOCHROMATOSIS DNA-PCR(C282Y,H63D)

## 2021-06-06 NOTE — Progress Notes (Signed)
============================================================ ============================================================  -    Hemochromatosis genes returned Negative ============================================================ ============================================================

## 2021-08-04 ENCOUNTER — Other Ambulatory Visit: Payer: Self-pay | Admitting: Internal Medicine

## 2021-08-04 MED ORDER — AZITHROMYCIN 250 MG PO TABS: ORAL_TABLET | ORAL | 1 refills | Status: DC

## 2021-08-04 MED ORDER — DEXAMETHASONE 4 MG PO TABS: ORAL_TABLET | ORAL | 0 refills | Status: DC

## 2021-08-10 ENCOUNTER — Other Ambulatory Visit: Payer: Self-pay | Admitting: Internal Medicine

## 2021-08-10 MED ORDER — TIRZEPATIDE 2.5 MG/0.5ML ~~LOC~~ SOAJ: 2.5000 mg | SUBCUTANEOUS | 2 refills | Status: DC

## 2021-08-11 ENCOUNTER — Ambulatory Visit: Payer: PPO | Admitting: Internal Medicine

## 2021-08-15 ENCOUNTER — Other Ambulatory Visit: Payer: Self-pay | Admitting: Internal Medicine

## 2021-08-15 DIAGNOSIS — E349 Endocrine disorder, unspecified: Secondary | ICD-10-CM

## 2021-08-15 MED ORDER — SILDENAFIL CITRATE 100 MG PO TABS: ORAL_TABLET | ORAL | 3 refills | Status: DC

## 2021-08-18 ENCOUNTER — Other Ambulatory Visit: Payer: Self-pay

## 2021-08-18 ENCOUNTER — Ambulatory Visit (INDEPENDENT_AMBULATORY_CARE_PROVIDER_SITE_OTHER): Payer: PPO | Admitting: Internal Medicine

## 2021-08-18 VITALS — BP 116/74 | HR 90 | Temp 98.4°F | Resp 16 | Ht 71.0 in | Wt 185.0 lb

## 2021-08-18 DIAGNOSIS — I480 Paroxysmal atrial fibrillation: Secondary | ICD-10-CM | POA: Diagnosis not present

## 2021-08-18 DIAGNOSIS — E349 Endocrine disorder, unspecified: Secondary | ICD-10-CM

## 2021-08-18 DIAGNOSIS — R7309 Other abnormal glucose: Secondary | ICD-10-CM

## 2021-08-18 DIAGNOSIS — Z125 Encounter for screening for malignant neoplasm of prostate: Secondary | ICD-10-CM | POA: Diagnosis not present

## 2021-08-18 DIAGNOSIS — N401 Enlarged prostate with lower urinary tract symptoms: Secondary | ICD-10-CM

## 2021-08-18 DIAGNOSIS — N138 Other obstructive and reflux uropathy: Secondary | ICD-10-CM | POA: Diagnosis not present

## 2021-08-18 DIAGNOSIS — R350 Frequency of micturition: Secondary | ICD-10-CM | POA: Diagnosis not present

## 2021-08-18 DIAGNOSIS — Z79899 Other long term (current) drug therapy: Secondary | ICD-10-CM | POA: Diagnosis not present

## 2021-08-18 DIAGNOSIS — I10 Essential (primary) hypertension: Secondary | ICD-10-CM

## 2021-08-18 DIAGNOSIS — E782 Mixed hyperlipidemia: Secondary | ICD-10-CM

## 2021-08-18 DIAGNOSIS — R002 Palpitations: Secondary | ICD-10-CM

## 2021-08-18 DIAGNOSIS — E559 Vitamin D deficiency, unspecified: Secondary | ICD-10-CM

## 2021-08-18 MED ORDER — APIXABAN 5 MG PO TABS: ORAL_TABLET | ORAL | 1 refills | Status: DC

## 2021-08-18 MED ORDER — DILTIAZEM HCL ER BEADS 240 MG PO CP24: ORAL_CAPSULE | ORAL | 2 refills | Status: DC

## 2021-08-18 NOTE — Progress Notes (Signed)
Future Appointments  Date Time Provider Department  08/18/2021  4:30 PM Unk Pinto, MD [facility]  10/28/2021 11:00 AM [name], NP [facility]    History of Present Illness:       This very nice 66 y.o. MWM presents for 3 month follow up with HTN, HLD, Pre-Diabetes and Vitamin D Deficiency.       Patient is recently treated for HTN & BP has been controlled at home. Todays BP is at goal -  116/74. Patient has had no complaints of any cardiac type chest pain, dyspnea / orthopnea /PND, dizziness, claudication or dependent edema, but has noted occasional palpitations, so   EKG was done and showed new Atrial Fibrillation.       Hyperlipidemia is controlled with diet. Last Lipids were at  goal :  Lab Results  Component Value Date   CHOL 173 02/06/2021   HDL 58 02/06/2021   LDLCALC 98 02/06/2021   TRIG 82 02/06/2021   CHOLHDL 3.0 02/06/2021     Also, the patient is monitored expectantly for glucose intolerance and has had no symptoms of reactive hypoglycemia, diabetic polys, paresthesias or visual blurring.  Last A1c was normal & at goal :  Lab Results  Component Value Date   HGBA1C 5.3 02/06/2021                                                           Further, the patient also has history of Vitamin D Deficiency ("33" /2018) and supplements vitamin D without any suspected side-effects. Last vitamin D was at goal :   Lab Results  Component Value Date   VD25OH 80 02/06/2021      Patient has hx/o Low T "326" in 2015. He had been treated with parenteral testosterone injections, but sometime he has d/c'd  it.  He had been taking some OTC  oral hormonal prep called "Male Extra" & on July 22,  his blood testosterone level measured astronomically high  at over 3000 !    On Feb 24, 2021 after he alleged had been off of the supplement his repeat Testosterone still measured >3000 !    Repeat measurements on 03/13/21 ( 1,490 )  and 03/27/2021  ( 1058 ) were still high   and  finally on 04/24/21  Testosterone was down to 643 and on 05/22/21  measured sl low at 329.  More recently , it was discovered he has been taking a different OTC supplement & today on 08/18/21 testosterone again measured elevated at 1,006.    Current Outpatient Medications on File Prior to Visit  Medication Sig   aspirin EC 81 MG tablet Take  daily.   bisoprolol  5 MG tablet Take  1/2 to 1 tablet  every Morning  for BP   sildenafil 100 MG tablet TAKE 1 TABLET  DAILY AS NEEDED    No Known Allergies   PMHx:  No past medical history on file.   Immunization History  Administered Date(s) Administered   Influenza, High Dose  10/16/2018   PFIZER  SARS-COV-2 Vacc 09/16/2019, 10/10/2019   PPD Test 03/13/2014     Past Surgical History:  Procedure Laterality Date   CATARACT EXTRACTION W/ INTRAOCULAR LENS IMPLANT Left 03/26/2013   Dr. Herbert Deaner    FHx:  Reviewed / unchanged  SHx:    Reviewed / unchanged   Systems Review:  Constitutional: Denies fever, chills, wt changes, headaches, insomnia, fatigue, night sweats, change in appetite. Eyes: Denies redness, blurred vision, diplopia, discharge, itchy, watery eyes.  ENT: Denies discharge, congestion, post nasal drip, epistaxis, sore throat, earache, hearing loss, dental pain, tinnitus, vertigo, sinus pain, snoring.  CV: Denies chest pain, palpitations, irregular heartbeat, syncope, dyspnea, diaphoresis, orthopnea, PND, claudication or edema. Respiratory: denies cough, dyspnea, DOE, pleurisy, hoarseness, laryngitis, wheezing.  Gastrointestinal: Denies dysphagia, odynophagia, heartburn, reflux, water brash, abdominal pain or cramps, nausea, vomiting, bloating, diarrhea, constipation, hematemesis, melena, hematochezia  or hemorrhoids. Genitourinary: Denies dysuria, frequency, urgency, nocturia, hesitancy, discharge, hematuria or flank pain. Musculoskeletal: Denies arthralgias, myalgias, stiffness, jt. swelling, pain, limping or  strain/sprain.  Skin: Denies pruritus, rash, hives, warts, acne, eczema or change in skin lesion(s). Neuro: No weakness, tremor, incoordination, spasms, paresthesia or pain. Psychiatric: Denies confusion, memory loss or sensory loss. Endo: Denies change in weight, skin or hair change.  Heme/Lymph: No excessive bleeding, bruising or enlarged lymph nodes.  Physical Exam  BP 116/74    Pulse 90    Temp 98.4 F (36.9 C)    Resp 16    Ht 5\' 11"  (1.803 m)    Wt 185 lb (83.9 kg)    SpO2 98%    BMI 25.80 kg/m   Appears  well nourished, well groomed  and in no distress.  Eyes: PERRLA, EOMs, conjunctiva no swelling or erythema. Sinuses: No frontal/maxillary tenderness ENT/Mouth: EAC's clear, TM's nl w/o erythema, bulging. Nares clear w/o erythema, swelling, exudates. Oropharynx clear without erythema or exudates. Oral hygiene is good. Tongue normal, non obstructing. Hearing intact.  Neck: Supple. Thyroid not palpable. Car 2+/2+ without bruits, nodes or JVD. Chest: Respirations nl with BS clear & equal w/o rales, rhonchi, wheezing or stridor.  Cor: Heart sounds normal w/ regular rate and rhythm without sig. murmurs, gallops, clicks or rubs. Peripheral pulses normal and equal  without edema.  Abdomen: Soft & bowel sounds normal. Non-tender w/o guarding, rebound, hernias, masses or organomegaly.  Lymphatics: Unremarkable.  Musculoskeletal: Full ROM all peripheral extremities, joint stability, 5/5 strength and normal gait.  Skin: Warm, dry without exposed rashes, lesions or ecchymosis apparent.  Neuro: Cranial nerves intact, reflexes equal bilaterally. Sensory-motor testing grossly intact. Tendon reflexes grossly intact.  Pysch: Alert & oriented x 3.  Insight and judgement nl & appropriate. No ideations.  Assessment and Plan:  1. Paroxysmal A-fib (HCC)  - New   - TSH - EKG 12-Lead  - diltiazem (TIAZAC) 240 MG 24 hr capsule;  Take  1 capsule  at night  for BP  & Heart Rhythm   Dispense: 30  capsule; Refill: 2  - Start Eliquis 5 mg  bid   2. Essential hypertension  - Continue medication, monitor blood pressure at home.  - Continue DASH diet.  Reminder to go to the ER if any CP,  SOB, nausea, dizziness, severe HA, changes vision/speech.  - CBC with Differential/Platelet - COMPLETE METABOLIC PANEL WITH GFR - Magnesium - TSH - EKG 12-Lead  3. Hyperlipidemia, mixed  - Continue diet/meds, exercise,& lifestyle modifications.  - Continue monitor periodic cholesterol/liver & renal functions   - Continue diet, exercise  - Lifestyle modifications.  - Monitor appropriate labs   - Lipid panel - TSH - EKG 12-Lead  4. Abnormal glucose  - Continue supplementation      - Hemoglobin A1c - Insulin, random - EKG 12-Lead  5. Vitamin D deficiency  - VITAMIN D 25 Hydroxy   6. Testosterone deficiency  - Patient is strongly discouraged from taking unknown  "male  enhancement" products found on the Internet.   - Testosterone  7. Palpitations  - TSH - EKG 12-Lead  8. Medication management  - CBC with Differential/Platelet - COMPLETE METABOLIC PANEL WITH GFR - Magnesium - Lipid panel - TSH - Hemoglobin A1c - Insulin, random - VITAMIN D 25 Hydroxy  - Testosterone  9. BPH with urinary obstruction  - PSA     Discussed  regular exercise, BP monitoring, weight control to achieve/maintain BMI less than 25 and discussed med and SE's. Recommended labs to assess and monitor clinical status with further disposition pending results of labs.  I discussed the assessment and treatment plan with the patient. The patient was provided an opportunity to ask questions and all were answered. The patient agreed with the plan and demonstrated an understanding of the instructions.  I provided over 30 minutes of exam, counseling, chart review and  complex critical decision making.     The patient was advised to call back or seek an in-person evaluation if the symptoms worsen or if  the condition fails to improve as anticipated.   Kirtland Bouchard, MD

## 2021-08-18 NOTE — Patient Instructions (Addendum)
Due to recent changes in healthcare laws, you may see the results of your imaging and laboratory studies on MyChart before your provider has had a chance to review them.  We understand that in some cases there may be results that are confusing or concerning to you. Not all laboratory results come back in the same time frame and the provider may be waiting for multiple results in order to interpret others.  Please give [place] 48 hours in order for your provider to thoroughly review all the results before contacting the office for clarification of your results.  ++++++++++++++++++++++++++++++++++   Atrial Fibrillation Atrial fibrillation is a type of irregular or rapid heartbeat (arrhythmia). In atrial fibrillation, the top part of the heart (atria) beats in an irregular pattern. This makes the heart unable to pump blood normally and effectively. The goal of treatment is to prevent blood clots from forming, control your heart rate, or restore your heartbeat to a normal rhythm. If this condition is not treated, it can cause serious problems, such as a weakened heart muscle (cardiomyopathy) or a stroke. What are the causes? This condition is often caused by medical conditions that damage the heart's electrical system. These include: High blood pressure (hypertension). This is the most common cause. Certain heart problems or conditions, such as heart failure, coronary artery disease, heart valve problems, or heart surgery. Diabetes. Overactive thyroid (hyperthyroidism). Obesity. Chronic kidney disease. In some cases, the cause of this condition is not known. What increases the risk? This condition is more likely to develop in: Older people. People who smoke. Athletes who do endurance exercise. People who have a family history of atrial fibrillation. Men. People who use drugs. People who drink a lot of alcohol. People who have lung conditions, such as emphysema, pneumonia, or COPD. People who have  obstructive sleep apnea. What are the signs or symptoms? Symptoms of this condition include: A feeling that your heart is racing or beating irregularly. Discomfort or pain in your chest. Shortness of breath. Sudden light-headedness or weakness. Tiring easily during exercise or activity. Fatigue. Syncope (fainting). Sweating. In some cases, there are no symptoms. How is this diagnosed? Your health care provider may detect atrial fibrillation when taking your pulse. If detected, this condition may be diagnosed with: An electrocardiogram (ECG) to check electrical signals of the heart. An ambulatory cardiac monitor to record your heart's activity for a few days. A transthoracic echocardiogram (TTE) to create pictures of your heart. A transesophageal echocardiogram (TEE) to create even closer pictures of your heart. A stress test to check your blood supply while you exercise. Imaging tests, such as a CT scan or chest X-ray. Blood tests. How is this treated? Treatment depends on underlying conditions and how you feel when you experience atrial fibrillation. This condition may be treated with: Medicines to prevent blood clots or to treat heart rate or heart rhythm problems. Electrical cardioversion to reset the heart's rhythm. A pacemaker to correct abnormal heart rhythm. Ablation to remove the heart tissue that sends abnormal signals. Left atrial appendage closure to seal the area where blood clots can form. In some cases, underlying conditions will be treated. Follow these instructions at home: Medicines Take over-the counter and prescription medicines only as told by your health care provider. Do not take any new medicines without talking to your health care provider. If you are taking blood thinners: Talk with your health care provider before you take any medicines that contain aspirin or NSAIDs, such as ibuprofen. These medicines  increase your risk for dangerous bleeding. Take your  medicine exactly as told, at the same time every day. Avoid activities that could cause injury or bruising, and follow instructions about how to prevent falls. Wear a medical alert bracelet or carry a card that lists what medicines you take. Lifestyle   Do not use any products that contain nicotine or tobacco, such as cigarettes, e-cigarettes, and chewing tobacco. If you need help quitting, ask your health care provider. Eat heart-healthy foods. Talk with a dietitian to make an eating plan that is right for you. Exercise regularly as told by your health care provider. Do not drink alcohol. Lose weight if you are overweight. Do not use drugs, including cannabis. General instructions If you have obstructive sleep apnea, manage your condition as told by your health care provider. Do not use diet pills unless your health care provider approves. Diet pills can make heart problems worse. Keep all follow-up visits as told by your health care provider. This is important. Contact a health care provider if you: Notice a change in the rate, rhythm, or strength of your heartbeat. Are taking a blood thinner and you notice more bruising. Tire more easily when you exercise or do heavy work. Have a sudden change in weight. Get help right away if you have:  Chest pain, abdominal pain, sweating, or weakness. Trouble breathing. Side effects of blood thinners, such as blood in your vomit, stool, or urine, or bleeding that cannot stop. Any symptoms of a stroke. "BE FAST" is an easy way to remember the main warning signs of a stroke: B - Balance. Signs are dizziness, sudden trouble walking, or loss of balance. E - Eyes. Signs are trouble seeing or a sudden change in vision. F - Face. Signs are sudden weakness or numbness of the face, or the face or eyelid drooping on one side. A - Arms. Signs are weakness or numbness in an arm. This happens suddenly and usually on one side of the body. S - Speech. Signs are  sudden trouble speaking, slurred speech, or trouble understanding what people say. T - Time. Time to call emergency services. Write down what time symptoms started. Other signs of a stroke, such as: A sudden, severe headache with no known cause. Nausea or vomiting. Seizure. These symptoms may represent a serious problem that is an emergency. Do not wait to see if the symptoms will go away. Get medical help right away. Call your local emergency services (911 in the [place] Do not drive yourself to the hospital. Summary Atrial fibrillation is a type of irregular or rapid heartbeat (arrhythmia). Symptoms include a feeling that your heart is beating fast or irregularly. You may be given medicines to prevent blood clots or to treat heart rate or heart rhythm problems. Get help right away if you have signs or symptoms of a stroke. Get help right away if you cannot catch your breath or have chest pain or pressure.   ++++++++++++++++++++++++++++++++++  Vit D  & Vit C 1,000 mg   are recommended to help protect  against the Covid-19 and other Corona viruses.    Also it's recommended  to take  Zinc 50 mg  to help  protect against the Covid-19   and best place to get  is also on [employer].com  and don't pay more than 6-8 cents /pill !   ===================================== Coronavirus (COVID-19) Are you at risk?  Are you at risk for the Coronavirus (COVID-19)?  To be considered HIGH RISK  for Coronavirus (COVID-19), you have to meet the following criteria:  Traveled to [place], [place], [place], [place] or [place]; or in the [place] to [place], [place], [place]  or [place]; and have fever, cough, and shortness of breath within the last 2 weeks of travel OR Been in close contact with a person diagnosed with COVID-19 within the last 2 weeks and have  fever, cough,and shortness of breath  IF YOU DO NOT MEET THESE CRITERIA, YOU ARE CONSIDERED LOW RISK FOR COVID-19.  What to do if  you are HIGH RISK for COVID-19?  If you are having a medical emergency, call 911. Seek medical care right away. Before you go to a doctors office, urgent care or emergency department,  call ahead and tell them about your recent travel, contact with someone diagnosed with COVID-19   and your symptoms.  You should receive instructions from your physicians office regarding next steps of care.  When you arrive at healthcare provider, tell the healthcare staff immediately you have returned from  visiting [place], [place], [place], [place] or [place]; or traveled in the [place] to [place], [place],  [place] or [place] in the last two weeks or you have been in close contact with a person diagnosed with  COVID-19 in the last 2 weeks.   Tell the health care staff about your symptoms: fever, cough and shortness of breath. After you have been seen by a medical provider, you will be either: Tested for (COVID-19) and discharged home on quarantine except to seek medical care if  symptoms worsen, and asked to  Stay home and avoid contact with others until you get your results (4-5 days)  Avoid travel on public transportation if possible (such as bus, train, or airplane) or Sent to the Emergency Department by EMS for evaluation, COVID-19 testing  and  possible admission depending on your condition and test results.  What to do if you are LOW RISK for COVID-19?  Reduce your risk of any infection by using the same precautions used for avoiding the common cold or flu:  Wash your hands often with soap and warm water for at least 20 seconds.  If soap and water are not readily available,  use an alcohol-based hand sanitizer with at least 60% alcohol.  If coughing or sneezing, cover your mouth and nose by coughing or sneezing into the elbow areas of your shirt or coat,  into a tissue or into your sleeve (not your hands). Avoid shaking hands with others and consider head nods or verbal greetings  only. Avoid touching your eyes, nose, or mouth with unwashed hands.  Avoid close contact with people who are sick. Avoid places or events with large numbers of people in one location, like concerts or sporting events. Carefully consider travel plans you have or are making. If you are planning any travel outside or inside the [place], visit the [facility] webpage for the latest health notices. If you have some symptoms but not all symptoms, continue to monitor at home and seek medical attention  if your symptoms worsen. If you are having a medical emergency, call 911.   ++++++++++++++++++++++++++++++++ Recommend Adult Low Dose Aspirin or  coated  Aspirin 81 mg daily  To reduce risk of Colon Cancer 40 %,  Skin Cancer 26 % ,  Melanoma 46%  and  Pancreatic cancer 60% ++++++++++++++++++++++++++++++++ Vitamin D goal  is between 70-100.  Please make sure that you are taking your  Vitamin D as directed.  It is very important as a natural anti-inflammatory  helping hair, skin, and nails, as well as reducing stroke and heart attack risk.  It helps your bones and helps with mood. It also decreases numerous cancer risks so please take it as directed.  Low Vit D is associated with a 200-300% higher risk for CANCER  and 200-300% higher risk for HEART   ATTACK  &  STROKE.   .....................................Marland Kitchen It is also associated with higher death rate at younger ages,  autoimmune diseases like Rheumatoid arthritis, Lupus, Multiple Sclerosis.    Also many other serious conditions, like depression, Alzheimer's Dementia, infertility, muscle aches, fatigue, fibromyalgia - just to name a few. ++++++++++++++++++++ Recommend the book "The END of DIETING" by Dr Excell Seltzer  & the book "The END of DIABETES " by Dr Excell Seltzer At [email or web address] - get book & Audio CD's    Being diabetic has a  300% increased risk for heart attack, stroke, cancer, and alzheimer- type vascular dementia. It is  very important that you work harder with diet by avoiding all foods that are white. Avoid white rice (brown & wild rice is OK), white potatoes (sweetpotatoes in moderation is OK), White bread or wheat bread or anything made out of white flour like bagels, donuts, rolls, buns, biscuits, cakes, pastries, cookies, pizza crust, and pasta (made from white flour & egg whites) - vegetarian pasta or spinach or wheat pasta is OK. Multigrain breads like Arnold's or Pepperidge Farm, or multigrain sandwich thins or flatbreads.  Diet, exercise and weight loss can reverse and cure diabetes in the early stages.  Diet, exercise and weight loss is very important in the control and prevention of complications of diabetes which affects every system in your body, ie. Brain - dementia/stroke, eyes - glaucoma/blindness, heart - heart attack/heart failure, kidneys - dialysis, stomach - gastric paralysis, intestines - malabsorption, nerves - severe painful neuritis, circulation - gangrene & loss of a leg(s), and finally cancer and Alzheimers.    I recommend avoid fried & greasy foods,  sweets/candy, white rice (brown or wild rice or Quinoa is OK), white potatoes (sweet potatoes are OK) - anything made from white flour - bagels, doughnuts, rolls, buns, biscuits,white and wheat breads, pizza crust and traditional pasta made of white flour & egg white(vegetarian pasta or spinach or wheat pasta is OK).  Multi-grain bread is OK - like multi-grain flat bread or sandwich thins. Avoid alcohol in excess. Exercise is also important.    Eat all the vegetables you want - avoid meat, especially red meat and dairy - especially cheese.  Cheese is the most concentrated form of trans-fats which is the worst thing to clog up our arteries. Veggie cheese is OK which can be found in the fresh produce section at [employer] or [employer] or [employer]  +++++++++++++++++++++ DASH Eating Plan  DASH stands for "Dietary Approaches to Stop Hypertension."    The DASH eating plan is a healthy eating plan that has been shown to reduce high blood pressure (hypertension). Additional health benefits may include reducing the risk of type 2 diabetes mellitus, heart disease, and stroke. The DASH eating plan may also help with weight loss. WHAT DO I NEED TO KNOW ABOUT THE DASH EATING PLAN? For the DASH eating plan, you will follow these general guidelines: Choose foods with a percent daily value for sodium of less than 5% (as listed on the food label). Use salt-free seasonings or herbs instead of table  salt or sea salt. Check with your health care provider or pharmacist before using salt substitutes. Eat lower-sodium products, often labeled as "lower sodium" or "no salt added." Eat fresh foods. Eat more vegetables, fruits, and low-fat dairy products. Choose whole grains. Look for the word "whole" as the first word in the ingredient list. Choose fish  Limit sweets, desserts, sugars, and sugary drinks. Choose heart-healthy fats. Eat veggie cheese  Eat more home-cooked food and less restaurant, buffet, and fast food. Limit fried foods. Cook foods using methods other than frying. Limit canned vegetables. If you do use them, rinse them well to decrease the sodium. When eating at a restaurant, ask that your food be prepared with less salt, or no salt if possible.                      WHAT FOODS CAN I EAT? Read Dr Fara Olden Fuhrman's books on The End of Dieting & The End of Diabetes  Grains Whole grain or whole wheat bread. Brown rice. Whole grain or whole wheat pasta. Quinoa, bulgur, and whole grain cereals. Low-sodium cereals. Corn or whole wheat flour tortillas. Whole grain cornbread. Whole grain crackers. Low-sodium crackers.  Vegetables Fresh or frozen vegetables (raw, steamed, roasted, or grilled). Low-sodium or reduced-sodium tomato and vegetable juices. Low-sodium or reduced-sodium tomato sauce and paste. Low-sodium or reduced-sodium canned vegetables.    Fruits All fresh, canned (in natural juice), or frozen fruits.  Protein Products  All fish and seafood.  Dried beans, peas, or lentils. Unsalted nuts and seeds. Unsalted canned beans.  Dairy Low-fat dairy products, such as skim or 1% milk, 2% or reduced-fat cheeses, low-fat ricotta or cottage cheese, or plain low-fat yogurt. Low-sodium or reduced-sodium cheeses.  Fats and Oils Tub margarines without trans fats. Light or reduced-fat mayonnaise and salad dressings (reduced sodium). Avocado. Safflower, olive, or canola oils. Natural peanut or almond butter.  Other Unsalted popcorn and pretzels. The items listed above may not be a complete list of recommended foods or beverages. Contact your dietitian for more options.  +++++++++++++++  WHAT FOODS ARE NOT RECOMMENDED? Grains/ White flour or wheat flour White bread. White pasta. White rice. Refined cornbread. Bagels and croissants. Crackers that contain trans fat.  Vegetables  Creamed or fried vegetables. Vegetables in a . Regular canned vegetables. Regular canned tomato sauce and paste. Regular tomato and vegetable juices.  Fruits Dried fruits. Canned fruit in light or heavy syrup. Fruit juice.  Meat and Other Protein Products Meat in general - RED meat & White meat.  Fatty cuts of meat. Ribs, chicken wings, all processed meats as bacon, sausage, bologna, salami, fatback, hot dogs, bratwurst and packaged luncheon meats.  Dairy Whole or 2% milk, cream, half-and-half, and cream cheese. Whole-fat or sweetened yogurt. Full-fat cheeses or blue cheese. Non-dairy creamers and whipped toppings. Processed cheese, cheese spreads, or cheese curds.  Condiments Onion and garlic salt, seasoned salt, table salt, and sea salt. Canned and packaged gravies. Worcestershire sauce. Tartar sauce. Barbecue sauce. Teriyaki sauce. Soy sauce, including reduced sodium. Steak sauce. Fish sauce. Oyster sauce. Cocktail sauce. Horseradish. Ketchup and mustard.  Meat flavorings and tenderizers. Bouillon cubes. Hot sauce. Tabasco sauce. Marinades. Taco seasonings. Relishes.  Fats and Oils Butter, stick margarine, lard, shortening and bacon fat. Coconut, palm kernel, or palm oils. Regular salad dressings.  Pickles and olives. Salted popcorn and pretzels.  The items listed above may not be a complete list of foods and beverages to avoid.

## 2021-08-19 ENCOUNTER — Encounter: Payer: Self-pay | Admitting: Podiatry

## 2021-08-19 LAB — COMPLETE METABOLIC PANEL WITH GFR
AG Ratio: 1.6 (calc) (ref 1.0–2.5)
ALT: 37 U/L (ref 9–46)
AST: 36 U/L — ABNORMAL HIGH (ref 10–35)
Albumin: 4.1 g/dL (ref 3.6–5.1)
Alkaline phosphatase (APISO): 63 U/L (ref 35–144)
BUN/Creatinine Ratio: 20 (calc) (ref 6–22)
BUN: 28 mg/dL — ABNORMAL HIGH (ref 7–25)
CO2: 26 mmol/L (ref 20–32)
Calcium: 9.8 mg/dL (ref 8.6–10.3)
Chloride: 104 mmol/L (ref 98–110)
Creat: 1.39 mg/dL — ABNORMAL HIGH (ref 0.70–1.35)
Globulin: 2.6 g/dL (calc) (ref 1.9–3.7)
Glucose, Bld: 87 mg/dL (ref 65–99)
Potassium: 4.4 mmol/L (ref 3.5–5.3)
Sodium: 138 mmol/L (ref 135–146)
Total Bilirubin: 0.6 mg/dL (ref 0.2–1.2)
Total Protein: 6.7 g/dL (ref 6.1–8.1)
eGFR: 56 mL/min/{1.73_m2} — ABNORMAL LOW (ref >=60)

## 2021-08-19 LAB — LIPID PANEL
Cholesterol: 222 mg/dL — ABNORMAL HIGH (ref <200)
HDL: 68 mg/dL (ref >=40)
LDL Cholesterol (Calc): 128 mg/dL (calc) — ABNORMAL HIGH
Non-HDL Cholesterol (Calc): 154 mg/dL (calc) — ABNORMAL HIGH (ref <130)
Total CHOL/HDL Ratio: 3.3 (calc) (ref <5.0)
Triglycerides: 147 mg/dL (ref <150)

## 2021-08-19 LAB — CBC WITH DIFFERENTIAL/PLATELET
Absolute Monocytes: 730 cells/uL (ref 200–950)
Basophils Absolute: 58 cells/uL (ref 0–200)
Basophils Relative: 0.7 %
Eosinophils Absolute: 133 cells/uL (ref 15–500)
Eosinophils Relative: 1.6 %
HCT: 51.8 % — ABNORMAL HIGH (ref 38.5–50.0)
Hemoglobin: 17.2 g/dL — ABNORMAL HIGH (ref 13.2–17.1)
Lymphs Abs: 1502 cells/uL (ref 850–3900)
MCH: 31.2 pg (ref 27.0–33.0)
MCHC: 33.2 g/dL (ref 32.0–36.0)
MCV: 94 fL (ref 80.0–100.0)
MPV: 9.8 fL (ref 7.5–12.5)
Monocytes Relative: 8.8 %
Neutro Abs: 5876 cells/uL (ref 1500–7800)
Neutrophils Relative %: 70.8 %
Platelets: 209 10*3/uL (ref 140–400)
RBC: 5.51 10*6/uL (ref 4.20–5.80)
RDW: 12.1 % (ref 11.0–15.0)
Total Lymphocyte: 18.1 %
WBC: 8.3 10*3/uL (ref 3.8–10.8)

## 2021-08-19 LAB — TSH: TSH: 5.56 mIU/L — ABNORMAL HIGH (ref 0.40–4.50)

## 2021-08-19 LAB — TEST AUTHORIZATION

## 2021-08-19 LAB — VITAMIN D 25 HYDROXY (VIT D DEFICIENCY, FRACTURES): Vit D, 25-Hydroxy: 77 ng/mL (ref 30–100)

## 2021-08-19 LAB — HEMOGLOBIN A1C
Hgb A1c MFr Bld: 5.9 % of total Hgb — ABNORMAL HIGH (ref <5.7)
Mean Plasma Glucose: 123 mg/dL
eAG (mmol/L): 6.8 mmol/L

## 2021-08-19 LAB — TESTOSTERONE: Testosterone: 1006 ng/dL — ABNORMAL HIGH (ref 250–827)

## 2021-08-19 LAB — MAGNESIUM: Magnesium: 2 mg/dL (ref 1.5–2.5)

## 2021-08-19 LAB — INSULIN, RANDOM: Insulin: 2.4 u[IU]/mL

## 2021-08-19 LAB — PSA: PSA: 0.56 ng/mL (ref <=4.00)

## 2021-08-19 NOTE — Progress Notes (Signed)
============================================================ °-   Test results slightly outside the reference range are not unusual. If there is anything important, I will review this with you,  otherwise it is considered normal test values.  If you have further questions,  please do not hesitate to contact me at the office or via My Chart.  ============================================================ ============================================================  -  CBC shows Hgb is slightly elevated , which could be due to dehydration ============================================================ ============================================================  -  Kidney functions  (( BUN, Creat & GFR ( Kidney flow rate) ))  - Also suggest mild dehydration ============================================================ ============================================================  -  Total  Chol =    222   - Elevated             (  Ideal  or  Goal is less than 180  !  )   - and   -  Bad / Dangerous LDL  Chol =  128   - also Elevated              (  Ideal  or  Goal is less than 70  !  )   - Recommend low cholesterol diet   - Cholesterol only comes from animal sources  - ie. meat, dairy, egg yolks  - Eat all the vegetables you want.  - Avoid meat, especially red meat - Beef AND Pork .  - Avoid cheese & dairy - milk & ice cream.     - Cheese is the most concentrated form of trans-fats which  is the worst thing to clog up our arteries.   - Veggie cheese is OK which can be found in the fresh  produce section at [employer] or [employer] or [employer] ============================================================ ============================================================  -  TSH is slightly elevated which suggests Thyroid hormone is low in blood   - This suggests either   - thyroid gland is underactive . . . . .  Or   - Thyroid gland is inflamed & not producing Thyroid hormone. . .  . . Or  - Taking a supplement called Biotin which causes false Thyroid measurements  ============================================================ ============================================================  -  Testosterone level is very high again which suggests   taking a supplement with too much Testosterone in it   - Elevated Testosterone levels  cause hair loss (baldness),   Prostate Enlargement, Increase risk for Prostate cancer,   Increased clotting  of blood which increases risk for   Stroke, Heart Attack, Blood clots in legs or lungs   ============================================================ ============================================================  -  Vitamin D = 77 - Excellent - Great - Please keep dose same ============================================================ ============================================================  -  PSA added to labs & still pending ============================================================ ============================================================  -  Strongly recommend STOP taking any  over the counter supplements   ============================================================ ============================================================

## 2021-08-20 NOTE — Progress Notes (Signed)
============================================================ °============================================================ ° °-    PSA - Returned Normal - Great !  ============================================================ ============================================================

## 2021-08-22 ENCOUNTER — Encounter: Payer: Self-pay | Admitting: Internal Medicine

## 2021-08-24 ENCOUNTER — Other Ambulatory Visit: Payer: Self-pay | Admitting: Internal Medicine

## 2021-08-24 DIAGNOSIS — I10 Essential (primary) hypertension: Secondary | ICD-10-CM

## 2021-08-24 DIAGNOSIS — I480 Paroxysmal atrial fibrillation: Secondary | ICD-10-CM

## 2021-08-25 ENCOUNTER — Encounter: Payer: Self-pay | Admitting: Internal Medicine

## 2021-08-25 ENCOUNTER — Other Ambulatory Visit: Payer: Self-pay | Admitting: Internal Medicine

## 2021-08-25 ENCOUNTER — Ambulatory Visit (INDEPENDENT_AMBULATORY_CARE_PROVIDER_SITE_OTHER): Payer: PPO

## 2021-08-25 DIAGNOSIS — I480 Paroxysmal atrial fibrillation: Secondary | ICD-10-CM

## 2021-08-25 NOTE — Progress Notes (Unsigned)
Enrolled for [employer] to mail a ZIO XT long term holter monitor to the patients address on file.  

## 2021-08-25 NOTE — Progress Notes (Signed)
° ° °  Future Appointments  Date Time Provider Department  08/26/2021 11:30 AM Unk Pinto, MD [facility]  09/23/2021  9:00 AM Fay Records, MD [facility]  10/28/2021 11:00 AM [name], NP [facility]    History of Present Illness:     Patient is a very nice 66 yo MWM ( husband of my Off Mgr)  with newly dx'd labile HTN ( July 2022)  & more recently pAfib ( 08/18/2021) who was initiated on Eliquis & added Diltiazem qhs with his qam Bisoprolol. Patient has 3 day Elwyn Reach pending & is scheduled to see Dr Dorris Carnes in 1 month on Mar 1st.  Patient has been a non-smoker, avid exerciser and Thyroid studies have been normal.    Medications    apixaban (ELIQUIS) 5 MG TABS tablet, Take  1 tablet  2 x /day (every 12 hours)  to prevent Blood Clots    bisoprolol (ZEBETA) 5 MG tablet, Take  1/2 to 1 tablet  every Morning  for BP    diltiazem (TIAZAC) 240 MG 24 hr capsule, Take  1 capsule  at Bedtime   for BP  & Heart Rhythm    sildenafil (VIAGRA) 100 MG tablet, TAKE 1 TABLET  DAILY AS NEEDED FOR XXXX   Problem list He has ADD (attention deficit disorder); Hyperlipidemia, mixed; Prediabetes; Vitamin D deficiency; Low back pain with right-sided sciatica; Essential hypertension; Body mass index (BMI) 28.0-28.9, adult; Chronic low back pain; Elevated blood-pressure reading, without diagnosis of hypertension; Pain in thumb joint with movement of left hand; Lumbar radiculopathy; and Spondylolisthesis on their problem list.   Observations/Objective:  BP 130/70    Pulse 82    Temp 97.9 F (36.6 C)    Resp 17    Ht 5\' 11"  (1.803 m)    Wt 186 lb (84.4 kg)    SpO2 95%    BMI 25.94 kg/m   HEENT - WNL. Neck - supple.  Chest - Clear equal BS. Cor - Nl HS.  Slightly irreg  rhythm  w/o sig MGR. PP 2(+). No edema. MS- FROM w/o deformities.  Gait Nl. Neuro -  Nl w/o focal abnormalities.   Assessment and Plan:  1. Labile hypertension  - EKG 12-Lead  2. Paroxysmal A-fib (HCC)  - EKG  12-Lead   Follow Up Instructions:        I discussed the assessment and treatment plan with the patient. The patient was provided an opportunity to ask questions and all were answered. The patient agreed with the plan and demonstrated an understanding of the instructions.       The patient was advised to call back or seek an in-person evaluation if the symptoms worsen or if the condition fails to improve as anticipated.   Kirtland Bouchard, MD

## 2021-08-26 ENCOUNTER — Encounter: Payer: Self-pay | Admitting: Internal Medicine

## 2021-08-26 ENCOUNTER — Other Ambulatory Visit: Payer: Self-pay

## 2021-08-26 ENCOUNTER — Ambulatory Visit (INDEPENDENT_AMBULATORY_CARE_PROVIDER_SITE_OTHER): Payer: PPO | Admitting: Internal Medicine

## 2021-08-26 VITALS — BP 130/70 | HR 82 | Temp 97.9°F | Resp 17 | Ht 71.0 in | Wt 186.0 lb

## 2021-08-26 DIAGNOSIS — R0989 Other specified symptoms and signs involving the circulatory and respiratory systems: Secondary | ICD-10-CM | POA: Diagnosis not present

## 2021-08-26 DIAGNOSIS — I480 Paroxysmal atrial fibrillation: Secondary | ICD-10-CM | POA: Diagnosis not present

## 2021-09-06 DIAGNOSIS — I480 Paroxysmal atrial fibrillation: Secondary | ICD-10-CM

## 2021-09-09 ENCOUNTER — Encounter: Payer: Self-pay | Admitting: Podiatry

## 2021-09-11 ENCOUNTER — Other Ambulatory Visit: Payer: Self-pay

## 2021-09-11 ENCOUNTER — Other Ambulatory Visit: Payer: PPO

## 2021-09-11 ENCOUNTER — Other Ambulatory Visit: Payer: Self-pay | Admitting: Internal Medicine

## 2021-09-11 DIAGNOSIS — I480 Paroxysmal atrial fibrillation: Secondary | ICD-10-CM

## 2021-09-11 DIAGNOSIS — E039 Hypothyroidism, unspecified: Secondary | ICD-10-CM | POA: Diagnosis not present

## 2021-09-11 DIAGNOSIS — Z79899 Other long term (current) drug therapy: Secondary | ICD-10-CM | POA: Diagnosis not present

## 2021-09-11 DIAGNOSIS — R7989 Other specified abnormal findings of blood chemistry: Secondary | ICD-10-CM | POA: Diagnosis not present

## 2021-09-11 DIAGNOSIS — N179 Acute kidney failure, unspecified: Secondary | ICD-10-CM | POA: Diagnosis not present

## 2021-09-11 DIAGNOSIS — R0989 Other specified symptoms and signs involving the circulatory and respiratory systems: Secondary | ICD-10-CM

## 2021-09-11 DIAGNOSIS — R791 Abnormal coagulation profile: Secondary | ICD-10-CM | POA: Diagnosis not present

## 2021-09-12 LAB — CBC WITH DIFFERENTIAL/PLATELET
Absolute Monocytes: 486 cells/uL (ref 200–950)
Basophils Absolute: 72 cells/uL (ref 0–200)
Basophils Relative: 1.6 %
Eosinophils Absolute: 131 cells/uL (ref 15–500)
Eosinophils Relative: 2.9 %
HCT: 50.3 % — ABNORMAL HIGH (ref 38.5–50.0)
Hemoglobin: 16.9 g/dL (ref 13.2–17.1)
Lymphs Abs: 1607 cells/uL (ref 850–3900)
MCH: 31.5 pg (ref 27.0–33.0)
MCHC: 33.6 g/dL (ref 32.0–36.0)
MCV: 93.8 fL (ref 80.0–100.0)
MPV: 10.1 fL (ref 7.5–12.5)
Monocytes Relative: 10.8 %
Neutro Abs: 2205 cells/uL (ref 1500–7800)
Neutrophils Relative %: 49 %
Platelets: 174 10*3/uL (ref 140–400)
RBC: 5.36 10*6/uL (ref 4.20–5.80)
RDW: 12 % (ref 11.0–15.0)
Total Lymphocyte: 35.7 %
WBC: 4.5 10*3/uL (ref 3.8–10.8)

## 2021-09-12 LAB — TESTOSTERONE: Testosterone: 299 ng/dL (ref 250–827)

## 2021-09-12 LAB — TSH: TSH: 3.76 mIU/L (ref 0.40–4.50)

## 2021-09-12 LAB — PROTIME-INR
INR: 1.1
Prothrombin Time: 11.4 s (ref 9.0–11.5)

## 2021-09-12 LAB — COMPLETE METABOLIC PANEL WITH GFR
AG Ratio: 1.5 (calc) (ref 1.0–2.5)
ALT: 29 U/L (ref 9–46)
AST: 23 U/L (ref 10–35)
Albumin: 3.9 g/dL (ref 3.6–5.1)
Alkaline phosphatase (APISO): 62 U/L (ref 35–144)
BUN/Creatinine Ratio: 17 (calc) (ref 6–22)
BUN: 23 mg/dL (ref 7–25)
CO2: 27 mmol/L (ref 20–32)
Calcium: 9.4 mg/dL (ref 8.6–10.3)
Chloride: 106 mmol/L (ref 98–110)
Creat: 1.36 mg/dL — ABNORMAL HIGH (ref 0.70–1.35)
Globulin: 2.6 g/dL (calc) (ref 1.9–3.7)
Glucose, Bld: 83 mg/dL (ref 65–99)
Potassium: 4.9 mmol/L (ref 3.5–5.3)
Sodium: 141 mmol/L (ref 135–146)
Total Bilirubin: 0.9 mg/dL (ref 0.2–1.2)
Total Protein: 6.5 g/dL (ref 6.1–8.1)
eGFR: 57 mL/min/{1.73_m2} — ABNORMAL LOW (ref >=60)

## 2021-09-12 NOTE — Progress Notes (Signed)
=============================================================== °-   Test results slightly outside the reference range are not unusual. If there is anything important, I will review this with you,  otherwise it is considered normal test values.  If you have further questions,  please do not hesitate to contact me at the office or via My Chart.  =============================================================== ===============================================================  -  CBC - Normal - OK   - Kidney Functions stable  in Stage 3   <><><><><><><><><><><><><><>  Thyroid Functions - back to Normal   <><><><><><><><><><><><><><>  - Testosterone level is normal   - Protime / INR clotting test is Normal as anticipated   =============================================================== ===============================================================

## 2021-09-15 ENCOUNTER — Encounter: Payer: Self-pay | Admitting: Cardiology

## 2021-09-15 ENCOUNTER — Other Ambulatory Visit: Payer: Self-pay

## 2021-09-15 ENCOUNTER — Ambulatory Visit: Payer: PPO | Admitting: Cardiology

## 2021-09-15 VITALS — BP 116/72 | HR 65 | Ht 71.0 in | Wt 185.8 lb

## 2021-09-15 DIAGNOSIS — R0789 Other chest pain: Secondary | ICD-10-CM

## 2021-09-15 DIAGNOSIS — R9431 Abnormal electrocardiogram [ECG] [EKG]: Secondary | ICD-10-CM

## 2021-09-15 DIAGNOSIS — E782 Mixed hyperlipidemia: Secondary | ICD-10-CM

## 2021-09-15 DIAGNOSIS — I480 Paroxysmal atrial fibrillation: Secondary | ICD-10-CM | POA: Diagnosis not present

## 2021-09-15 DIAGNOSIS — E559 Vitamin D deficiency, unspecified: Secondary | ICD-10-CM | POA: Diagnosis not present

## 2021-09-15 DIAGNOSIS — R7303 Prediabetes: Secondary | ICD-10-CM

## 2021-09-15 DIAGNOSIS — I10 Essential (primary) hypertension: Secondary | ICD-10-CM | POA: Diagnosis not present

## 2021-09-15 DIAGNOSIS — Z79899 Other long term (current) drug therapy: Secondary | ICD-10-CM

## 2021-09-15 DIAGNOSIS — R5383 Other fatigue: Secondary | ICD-10-CM

## 2021-09-15 MED ORDER — NITROGLYCERIN 0.4 MG SL SUBL: 0.4000 mg | SUBLINGUAL_TABLET | SUBLINGUAL | 3 refills | Status: DC | PRN

## 2021-09-15 NOTE — H&P (View-Only) (Signed)
Cardiology Office Note:    Date:  09/15/2021   ID:  Frank Hunt, DOB 10-14-1955, MRN [id number]  PCP:  Unk Pinto, MD  Cardiologist:  Berniece Salines, DO  Electrophysiologist:  None   Referring MD: Unk Pinto, MD   " I Recently diagnosed with atrial fibrillation"  History of Present Illness:    Frank Hunt is a 66 y.o. male with a hx of hypertension, hyperlipidemia, prediabetes, family history of premature coronary artery disease in his brother with a myocardial infarction at age 47 and recently diagnosed atrial fibrillation.  The patient and his wife are both in the office today.  They tell me that he was diagnosed with hypertension back in July 2022 And was started on medication.  Then in January 2023 diagnosed with atrial fibrillation.  His wife said that she noticed his heart beating irregularly when she lay her head on his chest.  She then requested that the patient be seen by his PCP.  She notes that she works as a Marine scientist.  He also tells me that he has had intermittent chest discomfort.  Described as a left-sided chest pain which radiates sometimes to his arms and shoulder.  Nothing makes it better or worse.  She reports associated fatigue and only intermittent shortness of breath.  They both think he is not the same person he used to be.  Since being diagnosed with atrial fibrillation   Past Medical History:  Diagnosis Date   Family history of premature CAD    Hyperlipidemia    Hypertension    PAF (paroxysmal atrial fibrillation) ([place])    Prediabetes     Past Surgical History:  Procedure Laterality Date   CATARACT EXTRACTION W/ INTRAOCULAR LENS IMPLANT Left 03/26/2013   Dr. Herbert Deaner    Current Medications: Current Meds  Medication Sig   apixaban (ELIQUIS) 5 MG TABS tablet Take  1 tablet  2 x /day every 12 hours ) to prevent Blood Clots   bisoprolol (ZEBETA) 5 MG tablet Take  1/2 to 1 tablet  every Morning  for BP   diltiazem (TIAZAC) 240 MG 24 hr capsule  Take  1 capsule  Daily  for BP  & Heart Rhythm   nitroGLYCERIN (NITROSTAT) 0.4 MG SL tablet Place 1 tablet (0.4 mg total) under the tongue every 5 (five) minutes as needed for chest pain.   sildenafil (VIAGRA) 100 MG tablet TAKE 1 TABLET  DAILY AS NEEDED FOR XXXX   [DISCONTINUED] aspirin EC 81 MG tablet Take 81 mg by mouth daily.     Allergies:   Patient has no known allergies.   Social History   Socioeconomic History   Marital status: Married    Spouse name: Not on file   Number of children: Not on file   Years of education: Not on file   Highest education level: Not on file  Occupational History   Not on file  Tobacco Use   Smoking status: Never   Smokeless tobacco: Never  Substance and Sexual Activity   Alcohol use: No   Drug use: Not on file   Sexual activity: Not on file  Other Topics Concern   Not on file  Social History Narrative   Not on file   Social Determinants of Health   Financial Resource Strain: Not on file  Food Insecurity: Not on file  Transportation Needs: Not on file  Physical Activity: Not on file  Stress: Not on file  Social Connections: Not on file  Family History: The patient's family history includes Heart attack in his brother.  ROS:   Review of Systems  Constitution: Negative for decreased appetite, fever and weight gain.  HENT: Negative for congestion, ear discharge, hoarse voice and sore throat.   Eyes: Negative for discharge, redness, vision loss in right eye and visual halos.  Cardiovascular: Reports chest pain and dyspnea on exertion;.Negative leg swelling, orthopnea and palpitations.  Respiratory: Negative for cough, hemoptysis, shortness of breath and snoring.   Endocrine: Negative for heat intolerance and polyphagia.  Hematologic/Lymphatic: Negative for bleeding problem. Does not bruise/bleed easily.  Skin: Negative for flushing, nail changes, rash and suspicious lesions.  Musculoskeletal: Negative for arthritis, joint pain,  muscle cramps, myalgias, neck pain and stiffness.  Gastrointestinal: Negative for abdominal pain, bowel incontinence, diarrhea and excessive appetite.  Genitourinary: Negative for decreased libido, genital sores and incomplete emptying.  Neurological: Negative for brief paralysis, focal weakness, headaches and loss of balance.  Psychiatric/Behavioral: Negative for altered mental status, depression and suicidal ideas.  Allergic/Immunologic: Negative for HIV exposure and persistent infections.    EKGs/Labs/Other Studies Reviewed:    The following studies were reviewed today:   EKG:  The ekg ordered today demonstrates atrial fibrillation with controlled ventricular rate 65 bpm with T wave inversion in the lateral leads  Recent Labs: 08/18/2021: Magnesium 2.0 09/11/2021: ALT 29; BUN 23; Creat 1.36; Hemoglobin 16.9; Platelets 174; Potassium 4.9; Sodium 141; TSH 3.76  Recent Lipid Panel    Component Value Date/Time   CHOL 222 (H) 08/18/2021 1557   TRIG 147 08/18/2021 1557   HDL 68 08/18/2021 1557   CHOLHDL 3.3 08/18/2021 1557   VLDL 24 10/15/2016 1301   LDLCALC 128 (H) 08/18/2021 1557    Physical Exam:    VS:  BP 116/72    Pulse 65    Ht 5\' 11"  (1.803 m)    Wt 185 lb 12.8 oz (84.3 kg)    SpO2 97%    BMI 25.91 kg/m     Wt Readings from Last 3 Encounters:  09/15/21 185 lb 12.8 oz (84.3 kg)  08/26/21 186 lb (84.4 kg)  08/18/21 185 lb (83.9 kg)     GEN: Well nourished, well developed in no acute distress HEENT: Normal NECK: No JVD; No carotid bruits LYMPHATICS: No lymphadenopathy CARDIAC: S1S2 noted,RRR, no murmurs, rubs, gallops RESPIRATORY:  Clear to auscultation without rales, wheezing or rhonchi  ABDOMEN: Soft, non-tender, non-distended, +bowel sounds, no guarding. EXTREMITIES: No edema, No cyanosis, no clubbing MUSCULOSKELETAL:  No deformity  SKIN: Warm and dry NEUROLOGIC:  Alert and oriented x 3, non-focal PSYCHIATRIC:  Normal affect, good insight  ASSESSMENT:    1.  PAF (paroxysmal atrial fibrillation) ([place])   2. Essential hypertension   3. Hyperlipidemia, mixed   4. Prediabetes   5. Vitamin D deficiency   6. Other chest pain   7. Abnormal EKG   8. Fatigue, unspecified type   9. Medication management    PLAN:    He is in atrial fibrillation today however rate control.  He is on Cardizem to 40 mg daily along with bisoprolol 5 mg daily.  He has been started on Eliquis 5 mg twice daily.  His CHA2DS2-VASc score is 2-we will continue his anticoagulation.  Unfortunately he is symptomatic with his atrial fibrillation with fatigue so we will pursue rhythm control with TEE/cardioversion.  He has not had an echocardiogram so I think it would be best to move with TEE to understand his LV function as well.  In terms of his chest discomfort family history as well as abnormal EKG we will get a pharmacologic nuclear stress test for this patient.  I have educated the patient about this test and he is agreeable to proceed with this. Sublingual nitroglycerin prescription was sent, its protocol and 911 protocol explained and the patient vocalized understanding questions were answered to the patient's satisfaction.  Hypertension-blood pressure is acceptable, continue with current antihypertensive regimen. Prediabetes-this is being managed by his primary care doctor.  No adjustments for antidiabetic medications were made today.  In the setting of his fatigue, atrial fibrillation to be appropriate to rule out sleep apnea in this patient.  We will get a sleep study.  The patient is in agreement with the above plan. The patient left the office in stable condition.  The patient will follow up in 12 weeks or sooner if needed.   Medication Adjustments/Labs and Tests Ordered: Current medicines are reviewed at length with the patient today.  Concerns regarding medicines are outlined above.  Orders Placed This Encounter  Procedures   Basic Metabolic Panel (BMET)   Magnesium    CBC with Differential/Platelet   MYOCARDIAL PERFUSION IMAGING   Split night study   Meds ordered this encounter  Medications   nitroGLYCERIN (NITROSTAT) 0.4 MG SL tablet    Sig: Place 1 tablet (0.4 mg total) under the tongue every 5 (five) minutes as needed for chest pain.    Dispense:  25 tablet    Refill:  3    Patient Instructions  Medication Instructions:  Your physician has recommended you make the following change in your medication:  START: Nitroglycerin 0.4 mg take one tablet by mouth every 5 minutes up to three times as needed for chest pain *If you need a refill on your cardiac medications before your next appointment, please call your pharmacy*   Lab Work: Your physician recommends that you return for lab work in:  TODAY: BMET, [place], CBC If you have labs (blood work) drawn today and your tests are completely normal, you will receive your results only by: MyChart Message (if you have MyChart) OR A paper copy in the mail If you have any lab test that is abnormal or we need to change your treatment, we will call you to review the results.   Testing/Procedures: Your physician has requested that you have a lexiscan myoview. For further information please visit [email or web address].[email or web address]. Please follow instruction sheet, as given.   The test will take approximately 3 to 4 hours to complete; you may bring reading material.  If someone comes with you to your appointment, they will need to remain in the main lobby due to limited space in the testing area. **If you are pregnant or breastfeeding, please notify the nuclear lab prior to your appointment**  How to prepare for your Myocardial Perfusion Test: Do not eat or drink 3 hours prior to your test, except you may have water. Do not consume products containing caffeine (regular or decaffeinated) 12 hours prior to your test. (ex: coffee, chocolate, sodas, tea). Do bring a list of your current medications with you.  If not listed below,  you may take your medications as normal. Do wear comfortable clothes (no dresses or overalls) and walking shoes, tennis shoes preferred (No heels or open toe shoes are allowed). Do NOT wear cologne, perfume, aftershave, or lotions (deodorant is allowed). If these instructions are not followed, your test will have to be rescheduled.  You are scheduled for a TEE Cardioversion  on March 13.  Please arrive at the [facility] (Main Entrance A) at [facility]: [address] [place], [place] [address] at 11am. (1 hour prior to procedure unless lab work is needed; if lab work is needed arrive 1.5 hours ahead)  DIET: Nothing to eat or drink after midnight except a sip of water with medications (see medication instructions below)  Medication Instructions:  Continue your anticoagulant: Eliquis. You will need to continue your anticoagulant after your procedure until you are told by   your provider that it is safe to stop   Labs: YOU WILL HAVE YOUR LABS DRAWN ON TODAY.  You must have a responsible person to drive you home and stay in the waiting area during your procedure. Failure to do so could result in cancellation.  Bring your insurance cards.  *Special Note: Every effort is made to have your procedure done on time. Occasionally there are emergencies that occur at the hospital that may cause delays. Please be patient if a delay does occur.   Your physician has recommended that you have a sleep study. This test records several body functions during sleep, including: brain activity, eye movement, oxygen and carbon dioxide blood levels, heart rate and rhythm, breathing rate and rhythm, the flow of air through your mouth and nose, snoring, body muscle movements, and chest and belly movement.   Follow-Up: At [facility], you and your health needs are our priority.  As part of our continuing mission to provide you with exceptional heart care, we have created designated Provider Care Teams.   These Care Teams include your primary Cardiologist (physician) and Advanced Practice Providers (APPs -  Physician Assistants and Nurse Practitioners) who all work together to provide you with the care you need, when you need it.  We recommend signing up for the patient portal called "MyChart".  Sign up information is provided on this After Visit Summary.  MyChart is used to connect with patients for Virtual Visits (Telemedicine).  Patients are able to view lab/test results, encounter notes, upcoming appointments, etc.  Non-urgent messages can be sent to your provider as well.   To learn more about what you can do with MyChart, go to [email or web address].    Your next appointment:   12 week(s)  The format for your next appointment:   In Person  Provider:   Berniece Salines, DO     Other Instructions  Diabetes Mellitus and Nutrition, Adult When you have diabetes, or diabetes mellitus, it is very important to have healthy eating habits because your blood sugar (glucose) levels are greatly affected by what you eat and drink. Eating healthy foods in the right amounts, at about the same times every day, can help you: Manage your blood glucose. Lower your risk of heart disease. Improve your blood pressure. Reach or maintain a healthy weight. What can affect my meal plan? Every person with diabetes is different, and each person has different needs for a meal plan. Your health care provider may recommend that you work with a dietitian to make a meal plan that is best for you. Your meal plan may vary depending on factors such as: The calories you need. The medicines you take. Your weight. Your blood glucose, blood pressure, and cholesterol levels. Your activity level. Other health conditions you have, such as heart or kidney disease. How do carbohydrates affect me? Carbohydrates, also called carbs, affect your blood glucose level more than any other type of food. Eating carbs raises the amount  of  glucose in your blood. It is important to know how many carbs you can safely have in each meal. This is different for every person. Your dietitian can help you calculate how many carbs you should have at each meal and for each snack. How does alcohol affect me? Alcohol can cause a decrease in blood glucose (hypoglycemia), especially if you use insulin or take certain diabetes medicines by mouth. Hypoglycemia can be a life-threatening condition. Symptoms of hypoglycemia, such as sleepiness, dizziness, and confusion, are similar to symptoms of having too much alcohol. Do not drink alcohol if: Your health care provider tells you not to drink. You are pregnant, may be pregnant, or are planning to become pregnant. If you drink alcohol: Limit how much you have to: 0-1 drink a day for women. 0-2 drinks a day for men. Know how much alcohol is in your drink. In the [place] one drink equals one 12 oz bottle of beer (355 mL), one 5 oz glass of wine (148 mL), or one 1 oz glass of hard liquor (44 mL). Keep yourself hydrated with water, diet soda, or unsweetened iced tea. Keep in mind that regular soda, juice, and other mixers may contain a lot of sugar and must be counted as carbs. What are tips for following this plan? Reading food labels Start by checking the serving size on the Nutrition Facts label of packaged foods and drinks. The number of calories and the amount of carbs, fats, and other nutrients listed on the label are based on one serving of the item. Many items contain more than one serving per package. Check the total grams (g) of carbs in one serving. Check the number of grams of saturated fats and trans fats in one serving. Choose foods that have a low amount or none of these fats. Check the number of milligrams (mg) of salt (sodium) in one serving. Most people should limit total sodium intake to less than 2,300 mg per day. Always check the nutrition information of foods labeled as "low-fat" or  "nonfat." These foods may be higher in added sugar or refined carbs and should be avoided. Talk to your dietitian to identify your daily goals for nutrients listed on the label. Shopping Avoid buying canned, pre-made, or processed foods. These foods tend to be high in fat, sodium, and added sugar. Shop around the outside edge of the grocery store. This is where you will most often find fresh fruits and vegetables, bulk grains, fresh meats, and fresh dairy products. Cooking Use low-heat cooking methods, such as baking, instead of high-heat cooking methods, such as deep frying. Cook using healthy oils, such as olive, canola, or sunflower oil. Avoid cooking with butter, cream, or high-fat meats. Meal planning Eat meals and snacks regularly, preferably at the same times every day. Avoid going long periods of time without eating. Eat foods that are high in fiber, such as fresh fruits, vegetables, beans, and whole grains. Eat 4-6 oz (112-168 g) of lean protein each day, such as lean meat, chicken, fish, eggs, or tofu. One ounce (oz) (28 g) of lean protein is equal to: 1 oz (28 g) of meat, chicken, or fish. 1 egg.  cup (62 g) of tofu. Eat some foods each day that contain healthy fats, such as avocado, nuts, seeds, and fish. What foods should I eat? Fruits Berries. Apples. Oranges. Peaches. Apricots. Plums. Grapes. Mangoes. Papayas. Pomegranates. Kiwi. Cherries. Vegetables Leafy greens, including lettuce, spinach, kale, chard, collard greens, mustard greens, and cabbage.  Beets. Cauliflower. Broccoli. Carrots. Green beans. Tomatoes. Peppers. Onions. Cucumbers. Brussels sprouts. Grains Whole grains, such as whole-wheat or whole-grain bread, crackers, tortillas, cereal, and pasta. Unsweetened oatmeal. Quinoa. Brown or wild rice. Meats and other proteins Seafood. Poultry without skin. Lean cuts of poultry and beef. Tofu. Nuts. Seeds. Dairy Low-fat or fat-free dairy products such as milk, yogurt, and  cheese. The items listed above may not be a complete list of foods and beverages you can eat and drink. Contact a dietitian for more information. What foods should I avoid? Fruits Fruits canned with syrup. Vegetables Canned vegetables. Frozen vegetables with butter or cream sauce. Grains Refined white flour and flour products such as bread, pasta, snack foods, and cereals. Avoid all processed foods. Meats and other proteins Fatty cuts of meat. Poultry with skin. Breaded or fried meats. Processed meat. Avoid saturated fats. Dairy Full-fat yogurt, cheese, or milk. Beverages Sweetened drinks, such as soda or iced tea. The items listed above may not be a complete list of foods and beverages you should avoid. Contact a dietitian for more information. Questions to ask a health care provider Do I need to meet with a certified diabetes care and education specialist? Do I need to meet with a dietitian? What number can I call if I have questions? When are the best times to check my blood glucose? Where to find more information: American Diabetes Association: diabetes.org Academy of Nutrition and Dietetics: eatright.[employer] of Diabetes and Digestive and Kidney Diseases: Am[employer].is Association of Diabetes Care & Education Specialists: diabeteseducator.org Summary It is important to have healthy eating habits because your blood sugar (glucose) levels are greatly affected by what you eat and drink. It is important to use alcohol carefully. A healthy meal plan will help you manage your blood glucose and lower your risk of heart disease. Your health care provider may recommend that you work with a dietitian to make a meal plan that is best for you. This information is not intended to replace advice given to you by your health care provider. Make sure you discuss any questions you have with your health care provider. Document Revised: 02/13/2020 Document Reviewed: 02/13/2020 Elsevier  Patient Education  [place].    Adopting a Healthy Lifestyle.  Know what a healthy weight is for you (roughly BMI <25) and aim to maintain this   Aim for 7+ servings of fruits and vegetables daily   65-80+ fluid ounces of water or unsweet tea for healthy kidneys   Limit to max 1 drink of alcohol per day; avoid smoking/tobacco   Limit animal fats in diet for cholesterol and heart health - choose grass fed whenever available   Avoid highly processed foods, and foods high in saturated/trans fats   Aim for low stress - take time to unwind and care for your mental health   Aim for 150 min of moderate intensity exercise weekly for heart health, and weights twice weekly for bone health   Aim for 7-9 hours of sleep daily   When it comes to diets, agreement about the perfect plan isnt easy to find, even among the experts. Experts at the [place] developed an idea known as the Healthy Eating Plate. Just imagine a plate divided into logical, healthy portions.   The emphasis is on diet quality:   Load up on vegetables and fruits - one-half of your plate: Aim for color and variety, and remember that potatoes dont count.   Go for whole  grains - one-quarter of your plate: Whole wheat, barley, wheat berries, quinoa, oats, brown rice, and foods made with them. If you want pasta, go with whole wheat pasta.   Protein power - one-quarter of your plate: Fish, chicken, beans, and nuts are all healthy, versatile protein sources. Limit red meat.   The diet, however, does go beyond the plate, offering a few other suggestions.   Use healthy plant oils, such as olive, canola, soy, corn, sunflower and peanut. Check the labels, and avoid partially hydrogenated oil, which have unhealthy trans fats.   If youre thirsty, drink water. Coffee and tea are good in moderation, but skip sugary drinks and limit milk and dairy products to one or two daily servings.   The type of  carbohydrate in the diet is more important than the amount. Some sources of carbohydrates, such as vegetables, fruits, whole grains, and beans-are healthier than others.   Finally, stay active  Signed, Berniece Salines, DO  09/15/2021 12:32 PM    [facility]

## 2021-09-15 NOTE — Progress Notes (Signed)
Cardiology Office Note:    Date:  09/15/2021   ID:  Frank Hunt, DOB 04-08-1956, MRN [id number]  PCP:  Unk Pinto, MD  Cardiologist:  Berniece Salines, DO  Electrophysiologist:  None   Referring MD: Unk Pinto, MD   " I Recently diagnosed with atrial fibrillation"  History of Present Illness:    Frank Hunt is a 66 y.o. male with a hx of hypertension, hyperlipidemia, prediabetes, family history of premature coronary artery disease in his brother with a myocardial infarction at age 80 and recently diagnosed atrial fibrillation.  The patient and his wife are both in the office today.  They tell me that he was diagnosed with hypertension back in July 2022 And was started on medication.  Then in January 2023 diagnosed with atrial fibrillation.  His wife said that she noticed his heart beating irregularly when she lay her head on his chest.  She then requested that the patient be seen by his PCP.  She notes that she works as a Marine scientist.  He also tells me that he has had intermittent chest discomfort.  Described as a left-sided chest pain which radiates sometimes to his arms and shoulder.  Nothing makes it better or worse.  She reports associated fatigue and only intermittent shortness of breath.  They both think he is not the same person he used to be.  Since being diagnosed with atrial fibrillation   Past Medical History:  Diagnosis Date   Family history of premature CAD    Hyperlipidemia    Hypertension    PAF (paroxysmal atrial fibrillation) ([place])    Prediabetes     Past Surgical History:  Procedure Laterality Date   CATARACT EXTRACTION W/ INTRAOCULAR LENS IMPLANT Left 03/26/2013   Dr. Herbert Deaner    Current Medications: Current Meds  Medication Sig   apixaban (ELIQUIS) 5 MG TABS tablet Take  1 tablet  2 x /day every 12 hours ) to prevent Blood Clots   bisoprolol (ZEBETA) 5 MG tablet Take  1/2 to 1 tablet  every Morning  for BP   diltiazem (TIAZAC) 240 MG 24 hr capsule  Take  1 capsule  Daily  for BP  & Heart Rhythm   nitroGLYCERIN (NITROSTAT) 0.4 MG SL tablet Place 1 tablet (0.4 mg total) under the tongue every 5 (five) minutes as needed for chest pain.   sildenafil (VIAGRA) 100 MG tablet TAKE 1 TABLET  DAILY AS NEEDED FOR XXXX   [DISCONTINUED] aspirin EC 81 MG tablet Take 81 mg by mouth daily.     Allergies:   Patient has no known allergies.   Social History   Socioeconomic History   Marital status: Married    Spouse name: Not on file   Number of children: Not on file   Years of education: Not on file   Highest education level: Not on file  Occupational History   Not on file  Tobacco Use   Smoking status: Never   Smokeless tobacco: Never  Substance and Sexual Activity   Alcohol use: No   Drug use: Not on file   Sexual activity: Not on file  Other Topics Concern   Not on file  Social History Narrative   Not on file   Social Determinants of Health   Financial Resource Strain: Not on file  Food Insecurity: Not on file  Transportation Needs: Not on file  Physical Activity: Not on file  Stress: Not on file  Social Connections: Not on file  Family History: The patient's family history includes Heart attack in his brother.  ROS:   Review of Systems  Constitution: Negative for decreased appetite, fever and weight gain.  HENT: Negative for congestion, ear discharge, hoarse voice and sore throat.   Eyes: Negative for discharge, redness, vision loss in right eye and visual halos.  Cardiovascular: Reports chest pain and dyspnea on exertion;.Negative leg swelling, orthopnea and palpitations.  Respiratory: Negative for cough, hemoptysis, shortness of breath and snoring.   Endocrine: Negative for heat intolerance and polyphagia.  Hematologic/Lymphatic: Negative for bleeding problem. Does not bruise/bleed easily.  Skin: Negative for flushing, nail changes, rash and suspicious lesions.  Musculoskeletal: Negative for arthritis, joint pain,  muscle cramps, myalgias, neck pain and stiffness.  Gastrointestinal: Negative for abdominal pain, bowel incontinence, diarrhea and excessive appetite.  Genitourinary: Negative for decreased libido, genital sores and incomplete emptying.  Neurological: Negative for brief paralysis, focal weakness, headaches and loss of balance.  Psychiatric/Behavioral: Negative for altered mental status, depression and suicidal ideas.  Allergic/Immunologic: Negative for HIV exposure and persistent infections.    EKGs/Labs/Other Studies Reviewed:    The following studies were reviewed today:   EKG:  The ekg ordered today demonstrates atrial fibrillation with controlled ventricular rate 65 bpm with T wave inversion in the lateral leads  Recent Labs: 08/18/2021: Magnesium 2.0 09/11/2021: ALT 29; BUN 23; Creat 1.36; Hemoglobin 16.9; Platelets 174; Potassium 4.9; Sodium 141; TSH 3.76  Recent Lipid Panel    Component Value Date/Time   CHOL 222 (H) 08/18/2021 1557   TRIG 147 08/18/2021 1557   HDL 68 08/18/2021 1557   CHOLHDL 3.3 08/18/2021 1557   VLDL 24 10/15/2016 1301   LDLCALC 128 (H) 08/18/2021 1557    Physical Exam:    VS:  BP 116/72    Pulse 65    Ht 5\' 11"  (1.803 m)    Wt 185 lb 12.8 oz (84.3 kg)    SpO2 97%    BMI 25.91 kg/m     Wt Readings from Last 3 Encounters:  09/15/21 185 lb 12.8 oz (84.3 kg)  08/26/21 186 lb (84.4 kg)  08/18/21 185 lb (83.9 kg)     GEN: Well nourished, well developed in no acute distress HEENT: Normal NECK: No JVD; No carotid bruits LYMPHATICS: No lymphadenopathy CARDIAC: S1S2 noted,RRR, no murmurs, rubs, gallops RESPIRATORY:  Clear to auscultation without rales, wheezing or rhonchi  ABDOMEN: Soft, non-tender, non-distended, +bowel sounds, no guarding. EXTREMITIES: No edema, No cyanosis, no clubbing MUSCULOSKELETAL:  No deformity  SKIN: Warm and dry NEUROLOGIC:  Alert and oriented x 3, non-focal PSYCHIATRIC:  Normal affect, good insight  ASSESSMENT:    1.  PAF (paroxysmal atrial fibrillation) ([place])   2. Essential hypertension   3. Hyperlipidemia, mixed   4. Prediabetes   5. Vitamin D deficiency   6. Other chest pain   7. Abnormal EKG   8. Fatigue, unspecified type   9. Medication management    PLAN:    He is in atrial fibrillation today however rate control.  He is on Cardizem to 40 mg daily along with bisoprolol 5 mg daily.  He has been started on Eliquis 5 mg twice daily.  His CHA2DS2-VASc score is 2-we will continue his anticoagulation.  Unfortunately he is symptomatic with his atrial fibrillation with fatigue so we will pursue rhythm control with TEE/cardioversion.  He has not had an echocardiogram so I think it would be best to move with TEE to understand his LV function as well.  In terms of his chest discomfort family history as well as abnormal EKG we will get a pharmacologic nuclear stress test for this patient.  I have educated the patient about this test and he is agreeable to proceed with this. Sublingual nitroglycerin prescription was sent, its protocol and 911 protocol explained and the patient vocalized understanding questions were answered to the patient's satisfaction.  Hypertension-blood pressure is acceptable, continue with current antihypertensive regimen. Prediabetes-this is being managed by his primary care doctor.  No adjustments for antidiabetic medications were made today.  In the setting of his fatigue, atrial fibrillation to be appropriate to rule out sleep apnea in this patient.  We will get a sleep study.  The patient is in agreement with the above plan. The patient left the office in stable condition.  The patient will follow up in 12 weeks or sooner if needed.   Medication Adjustments/Labs and Tests Ordered: Current medicines are reviewed at length with the patient today.  Concerns regarding medicines are outlined above.  Orders Placed This Encounter  Procedures   Basic Metabolic Panel (BMET)   Magnesium    CBC with Differential/Platelet   MYOCARDIAL PERFUSION IMAGING   Split night study   Meds ordered this encounter  Medications   nitroGLYCERIN (NITROSTAT) 0.4 MG SL tablet    Sig: Place 1 tablet (0.4 mg total) under the tongue every 5 (five) minutes as needed for chest pain.    Dispense:  25 tablet    Refill:  3    Patient Instructions  Medication Instructions:  Your physician has recommended you make the following change in your medication:  START: Nitroglycerin 0.4 mg take one tablet by mouth every 5 minutes up to three times as needed for chest pain *If you need a refill on your cardiac medications before your next appointment, please call your pharmacy*   Lab Work: Your physician recommends that you return for lab work in:  TODAY: BMET, [place], CBC If you have labs (blood work) drawn today and your tests are completely normal, you will receive your results only by: MyChart Message (if you have MyChart) OR A paper copy in the mail If you have any lab test that is abnormal or we need to change your treatment, we will call you to review the results.   Testing/Procedures: Your physician has requested that you have a lexiscan myoview. For further information please visit [email or web address]. Please follow instruction sheet, as given.   The test will take approximately 3 to 4 hours to complete; you may bring reading material.  If someone comes with you to your appointment, they will need to remain in the main lobby due to limited space in the testing area. **If you are pregnant or breastfeeding, please notify the nuclear lab prior to your appointment**  How to prepare for your Myocardial Perfusion Test: Do not eat or drink 3 hours prior to your test, except you may have water. Do not consume products containing caffeine (regular or decaffeinated) 12 hours prior to your test. (ex: coffee, chocolate, sodas, tea). Do bring a list of your current medications with you.  If not listed below,  you may take your medications as normal. Do wear comfortable clothes (no dresses or overalls) and walking shoes, tennis shoes preferred (No heels or open toe shoes are allowed). Do NOT wear cologne, perfume, aftershave, or lotions (deodorant is allowed). If these instructions are not followed, your test will have to be rescheduled.  You are scheduled for a TEE Cardioversion  on March 13.  Please arrive at the [facility] (Main Entrance A) at [facility]: [address] [place], [place] [address] at 11am. (1 hour prior to procedure unless lab work is needed; if lab work is needed arrive 1.5 hours ahead)  DIET: Nothing to eat or drink after midnight except a sip of water with medications (see medication instructions below)  Medication Instructions:  Continue your anticoagulant: Eliquis. You will need to continue your anticoagulant after your procedure until you are told by   your provider that it is safe to stop   Labs: YOU WILL HAVE YOUR LABS DRAWN ON TODAY.  You must have a responsible person to drive you home and stay in the waiting area during your procedure. Failure to do so could result in cancellation.  Bring your insurance cards.  *Special Note: Every effort is made to have your procedure done on time. Occasionally there are emergencies that occur at the hospital that may cause delays. Please be patient if a delay does occur.   Your physician has recommended that you have a sleep study. This test records several body functions during sleep, including: brain activity, eye movement, oxygen and carbon dioxide blood levels, heart rate and rhythm, breathing rate and rhythm, the flow of air through your mouth and nose, snoring, body muscle movements, and chest and belly movement.   Follow-Up: At [facility], you and your health needs are our priority.  As part of our continuing mission to provide you with exceptional heart care, we have created designated Provider Care Teams.   These Care Teams include your primary Cardiologist (physician) and Advanced Practice Providers (APPs -  Physician Assistants and Nurse Practitioners) who all work together to provide you with the care you need, when you need it.  We recommend signing up for the patient portal called "MyChart".  Sign up information is provided on this After Visit Summary.  MyChart is used to connect with patients for Virtual Visits (Telemedicine).  Patients are able to view lab/test results, encounter notes, upcoming appointments, etc.  Non-urgent messages can be sent to your provider as well.   To learn more about what you can do with MyChart, go to [email or web address].    Your next appointment:   12 week(s)  The format for your next appointment:   In Person  Provider:   Berniece Salines, DO     Other Instructions  Diabetes Mellitus and Nutrition, Adult When you have diabetes, or diabetes mellitus, it is very important to have healthy eating habits because your blood sugar (glucose) levels are greatly affected by what you eat and drink. Eating healthy foods in the right amounts, at about the same times every day, can help you: Manage your blood glucose. Lower your risk of heart disease. Improve your blood pressure. Reach or maintain a healthy weight. What can affect my meal plan? Every person with diabetes is different, and each person has different needs for a meal plan. Your health care provider may recommend that you work with a dietitian to make a meal plan that is best for you. Your meal plan may vary depending on factors such as: The calories you need. The medicines you take. Your weight. Your blood glucose, blood pressure, and cholesterol levels. Your activity level. Other health conditions you have, such as heart or kidney disease. How do carbohydrates affect me? Carbohydrates, also called carbs, affect your blood glucose level more than any other type of food. Eating carbs raises the amount  of  glucose in your blood. It is important to know how many carbs you can safely have in each meal. This is different for every person. Your dietitian can help you calculate how many carbs you should have at each meal and for each snack. How does alcohol affect me? Alcohol can cause a decrease in blood glucose (hypoglycemia), especially if you use insulin or take certain diabetes medicines by mouth. Hypoglycemia can be a life-threatening condition. Symptoms of hypoglycemia, such as sleepiness, dizziness, and confusion, are similar to symptoms of having too much alcohol. Do not drink alcohol if: Your health care provider tells you not to drink. You are pregnant, may be pregnant, or are planning to become pregnant. If you drink alcohol: Limit how much you have to: 0-1 drink a day for women. 0-2 drinks a day for men. Know how much alcohol is in your drink. In the [place] one drink equals one 12 oz bottle of beer (355 mL), one 5 oz glass of wine (148 mL), or one 1 oz glass of hard liquor (44 mL). Keep yourself hydrated with water, diet soda, or unsweetened iced tea. Keep in mind that regular soda, juice, and other mixers may contain a lot of sugar and must be counted as carbs. What are tips for following this plan? Reading food labels Start by checking the serving size on the Nutrition Facts label of packaged foods and drinks. The number of calories and the amount of carbs, fats, and other nutrients listed on the label are based on one serving of the item. Many items contain more than one serving per package. Check the total grams (g) of carbs in one serving. Check the number of grams of saturated fats and trans fats in one serving. Choose foods that have a low amount or none of these fats. Check the number of milligrams (mg) of salt (sodium) in one serving. Most people should limit total sodium intake to less than 2,300 mg per day. Always check the nutrition information of foods labeled as "low-fat" or  "nonfat." These foods may be higher in added sugar or refined carbs and should be avoided. Talk to your dietitian to identify your daily goals for nutrients listed on the label. Shopping Avoid buying canned, pre-made, or processed foods. These foods tend to be high in fat, sodium, and added sugar. Shop around the outside edge of the grocery store. This is where you will most often find fresh fruits and vegetables, bulk grains, fresh meats, and fresh dairy products. Cooking Use low-heat cooking methods, such as baking, instead of high-heat cooking methods, such as deep frying. Cook using healthy oils, such as olive, canola, or sunflower oil. Avoid cooking with butter, cream, or high-fat meats. Meal planning Eat meals and snacks regularly, preferably at the same times every day. Avoid going long periods of time without eating. Eat foods that are high in fiber, such as fresh fruits, vegetables, beans, and whole grains. Eat 4-6 oz (112-168 g) of lean protein each day, such as lean meat, chicken, fish, eggs, or tofu. One ounce (oz) (28 g) of lean protein is equal to: 1 oz (28 g) of meat, chicken, or fish. 1 egg.  cup (62 g) of tofu. Eat some foods each day that contain healthy fats, such as avocado, nuts, seeds, and fish. What foods should I eat? Fruits Berries. Apples. Oranges. Peaches. Apricots. Plums. Grapes. Mangoes. Papayas. Pomegranates. Kiwi. Cherries. Vegetables Leafy greens, including lettuce, spinach, kale, chard, collard greens, mustard greens, and cabbage.  Beets. Cauliflower. Broccoli. Carrots. Green beans. Tomatoes. Peppers. Onions. Cucumbers. Brussels sprouts. Grains Whole grains, such as whole-wheat or whole-grain bread, crackers, tortillas, cereal, and pasta. Unsweetened oatmeal. Quinoa. Brown or wild rice. Meats and other proteins Seafood. Poultry without skin. Lean cuts of poultry and beef. Tofu. Nuts. Seeds. Dairy Low-fat or fat-free dairy products such as milk, yogurt, and  cheese. The items listed above may not be a complete list of foods and beverages you can eat and drink. Contact a dietitian for more information. What foods should I avoid? Fruits Fruits canned with syrup. Vegetables Canned vegetables. Frozen vegetables with butter or cream sauce. Grains Refined white flour and flour products such as bread, pasta, snack foods, and cereals. Avoid all processed foods. Meats and other proteins Fatty cuts of meat. Poultry with skin. Breaded or fried meats. Processed meat. Avoid saturated fats. Dairy Full-fat yogurt, cheese, or milk. Beverages Sweetened drinks, such as soda or iced tea. The items listed above may not be a complete list of foods and beverages you should avoid. Contact a dietitian for more information. Questions to ask a health care provider Do I need to meet with a certified diabetes care and education specialist? Do I need to meet with a dietitian? What number can I call if I have questions? When are the best times to check my blood glucose? Where to find more information: American Diabetes Association: diabetes.org Academy of Nutrition and Dietetics: eatright.[employer] of Diabetes and Digestive and Kidney Diseases: Amen[employer].is Association of Diabetes Care & Education Specialists: diabeteseducator.org Summary It is important to have healthy eating habits because your blood sugar (glucose) levels are greatly affected by what you eat and drink. It is important to use alcohol carefully. A healthy meal plan will help you manage your blood glucose and lower your risk of heart disease. Your health care provider may recommend that you work with a dietitian to make a meal plan that is best for you. This information is not intended to replace advice given to you by your health care provider. Make sure you discuss any questions you have with your health care provider. Document Revised: 02/13/2020 Document Reviewed: 02/13/2020 Elsevier  Patient Education  [place].    Adopting a Healthy Lifestyle.  Know what a healthy weight is for you (roughly BMI <25) and aim to maintain this   Aim for 7+ servings of fruits and vegetables daily   65-80+ fluid ounces of water or unsweet tea for healthy kidneys   Limit to max 1 drink of alcohol per day; avoid smoking/tobacco   Limit animal fats in diet for cholesterol and heart health - choose grass fed whenever available   Avoid highly processed foods, and foods high in saturated/trans fats   Aim for low stress - take time to unwind and care for your mental health   Aim for 150 min of moderate intensity exercise weekly for heart health, and weights twice weekly for bone health   Aim for 7-9 hours of sleep daily   When it comes to diets, agreement about the perfect plan isnt easy to find, even among the experts. Experts at the [employer] developed an idea known as the Healthy Eating Plate. Just imagine a plate divided into logical, healthy portions.   The emphasis is on diet quality:   Load up on vegetables and fruits - one-half of your plate: Aim for color and variety, and remember that potatoes dont count.   Go for whole  grains - one-quarter of your plate: Whole wheat, barley, wheat berries, quinoa, oats, brown rice, and foods made with them. If you want pasta, go with whole wheat pasta.   Protein power - one-quarter of your plate: Fish, chicken, beans, and nuts are all healthy, versatile protein sources. Limit red meat.   The diet, however, does go beyond the plate, offering a few other suggestions.   Use healthy plant oils, such as olive, canola, soy, corn, sunflower and peanut. Check the labels, and avoid partially hydrogenated oil, which have unhealthy trans fats.   If youre thirsty, drink water. Coffee and tea are good in moderation, but skip sugary drinks and limit milk and dairy products to one or two daily servings.   The type of  carbohydrate in the diet is more important than the amount. Some sources of carbohydrates, such as vegetables, fruits, whole grains, and beans-are healthier than others.   Finally, stay active  Signed, Berniece Salines, DO  09/15/2021 12:32 PM     [facility]

## 2021-09-15 NOTE — Patient Instructions (Addendum)
Medication Instructions:  Your physician has recommended you make the following change in your medication:  START: Nitroglycerin 0.4 mg take one tablet by mouth every 5 minutes up to three times as needed for chest pain *If you need a refill on your cardiac medications before your next appointment, please call your pharmacy*   Lab Work: Your physician recommends that you return for lab work in:  TODAY: BMET, [place], CBC If you have labs (blood work) drawn today and your tests are completely normal, you will receive your results only by: MyChart Message (if you have MyChart) OR A paper copy in the mail If you have any lab test that is abnormal or we need to change your treatment, we will call you to review the results.   Testing/Procedures: Your physician has requested that you have a lexiscan myoview. For further information please visit [email or web address]. Please follow instruction sheet, as given.   The test will take approximately 3 to 4 hours to complete; you may bring reading material.  If someone comes with you to your appointment, they will need to remain in the main lobby due to limited space in the testing area. **If you are pregnant or breastfeeding, please notify the nuclear lab prior to your appointment**  How to prepare for your Myocardial Perfusion Test: Do not eat or drink 3 hours prior to your test, except you may have water. Do not consume products containing caffeine (regular or decaffeinated) 12 hours prior to your test. (ex: coffee, chocolate, sodas, tea). Do bring a list of your current medications with you.  If not listed below, you may take your medications as normal. Do wear comfortable clothes (no dresses or overalls) and walking shoes, tennis shoes preferred (No heels or open toe shoes are allowed). Do NOT wear cologne, perfume, aftershave, or lotions (deodorant is allowed). If these instructions are not followed, your test will have to be rescheduled.  You are  scheduled for a TEE Cardioversion on March 13.  Please arrive at the [facility] (Main Entrance A) at [facility]: [address] [place], [place] [address] at 11am. (1 hour prior to procedure unless lab work is needed; if lab work is needed arrive 1.5 hours ahead)  DIET: Nothing to eat or drink after midnight except a sip of water with medications (see medication instructions below)  Medication Instructions:  Continue your anticoagulant: Eliquis. You will need to continue your anticoagulant after your procedure until you are told by   your provider that it is safe to stop   Labs: YOU WILL HAVE YOUR LABS DRAWN ON TODAY.  You must have a responsible person to drive you home and stay in the waiting area during your procedure. Failure to do so could result in cancellation.  Bring your insurance cards.  *Special Note: Every effort is made to have your procedure done on time. Occasionally there are emergencies that occur at the hospital that may cause delays. Please be patient if a delay does occur.   Your physician has recommended that you have a sleep study. This test records several body functions during sleep, including: brain activity, eye movement, oxygen and carbon dioxide blood levels, heart rate and rhythm, breathing rate and rhythm, the flow of air through your mouth and nose, snoring, body muscle movements, and chest and belly movement.   Follow-Up: At [facility], you and your health needs are our priority.  As part of our continuing mission to provide you with exceptional heart care, we  have created designated Provider Care Teams.  These Care Teams include your primary Cardiologist (physician) and Advanced Practice Providers (APPs -  Physician Assistants and Nurse Practitioners) who all work together to provide you with the care you need, when you need it.  We recommend signing up for the patient portal called "MyChart".  Sign up information is provided on this After Visit  Summary.  MyChart is used to connect with patients for Virtual Visits (Telemedicine).  Patients are able to view lab/test results, encounter notes, upcoming appointments, etc.  Non-urgent messages can be sent to your provider as well.   To learn more about what you can do with MyChart, go to [email or web address].    Your next appointment:   12 week(s)  The format for your next appointment:   In Person  Provider:   Berniece Salines, DO     Other Instructions  Diabetes Mellitus and Nutrition, Adult When you have diabetes, or diabetes mellitus, it is very important to have healthy eating habits because your blood sugar (glucose) levels are greatly affected by what you eat and drink. Eating healthy foods in the right amounts, at about the same times every day, can help you: Manage your blood glucose. Lower your risk of heart disease. Improve your blood pressure. Reach or maintain a healthy weight. What can affect my meal plan? Every person with diabetes is different, and each person has different needs for a meal plan. Your health care provider may recommend that you work with a dietitian to make a meal plan that is best for you. Your meal plan may vary depending on factors such as: The calories you need. The medicines you take. Your weight. Your blood glucose, blood pressure, and cholesterol levels. Your activity level. Other health conditions you have, such as heart or kidney disease. How do carbohydrates affect me? Carbohydrates, also called carbs, affect your blood glucose level more than any other type of food. Eating carbs raises the amount of glucose in your blood. It is important to know how many carbs you can safely have in each meal. This is different for every person. Your dietitian can help you calculate how many carbs you should have at each meal and for each snack. How does alcohol affect me? Alcohol can cause a decrease in blood glucose (hypoglycemia), especially if you use  insulin or take certain diabetes medicines by mouth. Hypoglycemia can be a life-threatening condition. Symptoms of hypoglycemia, such as sleepiness, dizziness, and confusion, are similar to symptoms of having too much alcohol. Do not drink alcohol if: Your health care provider tells you not to drink. You are pregnant, may be pregnant, or are planning to become pregnant. If you drink alcohol: Limit how much you have to: 0-1 drink a day for women. 0-2 drinks a day for men. Know how much alcohol is in your drink. In the [place] one drink equals one 12 oz bottle of beer (355 mL), one 5 oz glass of wine (148 mL), or one 1 oz glass of hard liquor (44 mL). Keep yourself hydrated with water, diet soda, or unsweetened iced tea. Keep in mind that regular soda, juice, and other mixers may contain a lot of sugar and must be counted as carbs. What are tips for following this plan? Reading food labels Start by checking the serving size on the Nutrition Facts label of packaged foods and drinks. The number of calories and the amount of carbs, fats, and other nutrients listed on the label are based on  one serving of the item. Many items contain more than one serving per package. Check the total grams (g) of carbs in one serving. Check the number of grams of saturated fats and trans fats in one serving. Choose foods that have a low amount or none of these fats. Check the number of milligrams (mg) of salt (sodium) in one serving. Most people should limit total sodium intake to less than 2,300 mg per day. Always check the nutrition information of foods labeled as "low-fat" or "nonfat." These foods may be higher in added sugar or refined carbs and should be avoided. Talk to your dietitian to identify your daily goals for nutrients listed on the label. Shopping Avoid buying canned, pre-made, or processed foods. These foods tend to be high in fat, sodium, and added sugar. Shop around the outside edge of the grocery  store. This is where you will most often find fresh fruits and vegetables, bulk grains, fresh meats, and fresh dairy products. Cooking Use low-heat cooking methods, such as baking, instead of high-heat cooking methods, such as deep frying. Cook using healthy oils, such as olive, canola, or sunflower oil. Avoid cooking with butter, cream, or high-fat meats. Meal planning Eat meals and snacks regularly, preferably at the same times every day. Avoid going long periods of time without eating. Eat foods that are high in fiber, such as fresh fruits, vegetables, beans, and whole grains. Eat 4-6 oz (112-168 g) of lean protein each day, such as lean meat, chicken, fish, eggs, or tofu. One ounce (oz) (28 g) of lean protein is equal to: 1 oz (28 g) of meat, chicken, or fish. 1 egg.  cup (62 g) of tofu. Eat some foods each day that contain healthy fats, such as avocado, nuts, seeds, and fish. What foods should I eat? Fruits Berries. Apples. Oranges. Peaches. Apricots. Plums. Grapes. Mangoes. Papayas. Pomegranates. Kiwi. Cherries. Vegetables Leafy greens, including lettuce, spinach, kale, chard, collard greens, mustard greens, and cabbage. Beets. Cauliflower. Broccoli. Carrots. Green beans. Tomatoes. Peppers. Onions. Cucumbers. Brussels sprouts. Grains Whole grains, such as whole-wheat or whole-grain bread, crackers, tortillas, cereal, and pasta. Unsweetened oatmeal. Quinoa. Brown or wild rice. Meats and other proteins Seafood. Poultry without skin. Lean cuts of poultry and beef. Tofu. Nuts. Seeds. Dairy Low-fat or fat-free dairy products such as milk, yogurt, and cheese. The items listed above may not be a complete list of foods and beverages you can eat and drink. Contact a dietitian for more information. What foods should I avoid? Fruits Fruits canned with syrup. Vegetables Canned vegetables. Frozen vegetables with butter or cream sauce. Grains Refined white flour and flour products such as  bread, pasta, snack foods, and cereals. Avoid all processed foods. Meats and other proteins Fatty cuts of meat. Poultry with skin. Breaded or fried meats. Processed meat. Avoid saturated fats. Dairy Full-fat yogurt, cheese, or milk. Beverages Sweetened drinks, such as soda or iced tea. The items listed above may not be a complete list of foods and beverages you should avoid. Contact a dietitian for more information. Questions to ask a health care provider Do I need to meet with a certified diabetes care and education specialist? Do I need to meet with a dietitian? What number can I call if I have questions? When are the best times to check my blood glucose? Where to find more information: American Diabetes Association: diabetes.org Academy of Nutrition and Dietetics: eatright.[employer] of Diabetes and Digestive and Kidney Diseases: AmenCredit.is Association of Diabetes Care & Education  Specialists: diabeteseducator.org Summary It is important to have healthy eating habits because your blood sugar (glucose) levels are greatly affected by what you eat and drink. It is important to use alcohol carefully. A healthy meal plan will help you manage your blood glucose and lower your risk of heart disease. Your health care provider may recommend that you work with a dietitian to make a meal plan that is best for you. This information is not intended to replace advice given to you by your health care provider. Make sure you discuss any questions you have with your health care provider. Document Revised: 02/13/2020 Document Reviewed: 02/13/2020 Elsevier Patient Education  [employer].

## 2021-09-16 ENCOUNTER — Encounter: Payer: Self-pay | Admitting: Internal Medicine

## 2021-09-16 ENCOUNTER — Telehealth (HOSPITAL_COMMUNITY): Payer: Self-pay | Admitting: *Deleted

## 2021-09-16 LAB — CBC WITH DIFFERENTIAL/PLATELET
Basophils Absolute: 0 10*3/uL (ref 0.0–0.2)
Basos: 1 %
EOS (ABSOLUTE): 0.2 10*3/uL (ref 0.0–0.4)
Eos: 3 %
Hematocrit: 50 % (ref 37.5–51.0)
Hemoglobin: 16.7 g/dL (ref 13.0–17.7)
Immature Grans (Abs): 0 10*3/uL (ref 0.0–0.1)
Immature Granulocytes: 0 %
Lymphocytes Absolute: 1.4 10*3/uL (ref 0.7–3.1)
Lymphs: 28 %
MCH: 31.2 pg (ref 26.6–33.0)
MCHC: 33.4 g/dL (ref 31.5–35.7)
MCV: 93 fL (ref 79–97)
Monocytes Absolute: 0.6 10*3/uL (ref 0.1–0.9)
Monocytes: 12 %
Neutrophils Absolute: 2.8 10*3/uL (ref 1.4–7.0)
Neutrophils: 56 %
Platelets: 173 10*3/uL (ref 150–450)
RBC: 5.36 x10E6/uL (ref 4.14–5.80)
RDW: 12.2 % (ref 11.6–15.4)
WBC: 4.9 10*3/uL (ref 3.4–10.8)

## 2021-09-16 LAB — BASIC METABOLIC PANEL
BUN/Creatinine Ratio: 14 (ref 10–24)
BUN: 15 mg/dL (ref 8–27)
CO2: 22 mmol/L (ref 20–29)
Calcium: 9.4 mg/dL (ref 8.6–10.2)
Chloride: 101 mmol/L (ref 96–106)
Creatinine, Ser: 1.1 mg/dL (ref 0.76–1.27)
Glucose: 90 mg/dL (ref 70–99)
Potassium: 4.5 mmol/L (ref 3.5–5.2)
Sodium: 142 mmol/L (ref 134–144)
eGFR: 73 mL/min/{1.73_m2} (ref >59)

## 2021-09-16 LAB — MAGNESIUM: Magnesium: 1.7 mg/dL (ref 1.6–2.3)

## 2021-09-16 NOTE — Telephone Encounter (Signed)
Close encounter 

## 2021-09-16 NOTE — Addendum Note (Signed)
Addended by: Hinton Dyer on: 09/16/2021 08:41 AM   Modules accepted: Orders

## 2021-09-18 ENCOUNTER — Other Ambulatory Visit: Payer: Self-pay

## 2021-09-18 ENCOUNTER — Ambulatory Visit (HOSPITAL_COMMUNITY)
Admission: RE | Admit: 2021-09-18 | Discharge: 2021-09-18 | Disposition: A | Discharge status: Home / Self Care | Payer: PPO | Source: Ambulatory Visit | Attending: Cardiology | Admitting: Cardiology

## 2021-09-18 DIAGNOSIS — I480 Paroxysmal atrial fibrillation: Secondary | ICD-10-CM | POA: Diagnosis not present

## 2021-09-18 DIAGNOSIS — R0789 Other chest pain: Secondary | ICD-10-CM | POA: Diagnosis not present

## 2021-09-18 LAB — MYOCARDIAL PERFUSION IMAGING
LV dias vol: 151 mL (ref 62–150)
LV sys vol: 89 mL
Nuc Stress EF: 41 %
Peak HR: 86 {beats}/min
Rest HR: 62 {beats}/min
Rest Nuclear Isotope Dose: 10.6 mCi
SDS: 0
SRS: 2
SSS: 2
ST Depression (mm): 0 mm
Stress Nuclear Isotope Dose: 31.4 mCi
TID: 1.11

## 2021-09-18 MED ORDER — TECHNETIUM TC 99M TETROFOSMIN IV KIT
31.4000 | PACK | Freq: Once | INTRAVENOUS | Status: AC | PRN
  Administered 2021-09-18: 31.4 via INTRAVENOUS
  Filled 2021-09-18: qty 32

## 2021-09-18 MED ORDER — REGADENOSON 0.4 MG/5ML IV SOLN
0.4000 mg | Freq: Once | INTRAVENOUS | Status: AC
  Administered 2021-09-18: 0.4 mg via INTRAVENOUS

## 2021-09-18 MED ORDER — TECHNETIUM TC 99M TETROFOSMIN IV KIT
10.6000 | PACK | Freq: Once | INTRAVENOUS | Status: AC | PRN
  Administered 2021-09-18: 10.6 via INTRAVENOUS
  Filled 2021-09-18: qty 11

## 2021-09-22 ENCOUNTER — Encounter: Payer: Self-pay | Admitting: Podiatry

## 2021-09-22 ENCOUNTER — Ambulatory Visit: Payer: PPO | Admitting: Podiatry

## 2021-09-22 ENCOUNTER — Other Ambulatory Visit: Payer: Self-pay

## 2021-09-22 DIAGNOSIS — L603 Nail dystrophy: Secondary | ICD-10-CM

## 2021-09-22 MED ORDER — TERBINAFINE HCL 250 MG PO TABS: 250.0000 mg | ORAL_TABLET | Freq: Every day | ORAL | 0 refills | Status: DC

## 2021-09-22 NOTE — Patient Instructions (Signed)
Terbinafine Tablets What is this medication? TERBINAFINE (TER bin a feen) treats fungal infections of the nails. It belongs to a group of medications called antifungals. It will not treat infections caused by bacteria or viruses. This medicine may be used for other purposes; ask your health care provider or pharmacist if you have questions. COMMON BRAND NAME(S): Lamisil, Terbinex What should I tell my care team before I take this medication? They need to know if you have any of these conditions: Liver disease An unusual or allergic reaction to terbinafine, other medications, foods, dyes, or preservatives Pregnant or trying to get pregnant Breast-feeding How should I use this medication? Take this medication by mouth with water. Take it as directed on the prescription label at the same time every day. You can take it with or without food. If it upsets your stomach, take it with food. Keep taking it unless your care team tells you to stop. A special MedGuide will be given to you by the pharmacist with each prescription and refill. Be sure to read this information carefully each time. Talk to your care team regarding the use of this medication in children. Special care may be needed. Overdosage: If you think you have taken too much of this medicine contact a poison control center or emergency room at once. NOTE: This medicine is only for you. Do not share this medicine with others. What if I miss a dose? If you miss a dose, take it as soon as you can unless it is more than 4 hours late. If it is more than 4 hours late, skip the missed dose. Take the next dose at the normal time. What may interact with this medication? Do not take this medication with any of the following: Pimozide Thioridazine This medication may also interact with the following: Beta blockers Caffeine Certain medications for mental health conditions Cimetidine Cyclosporine Medications for fungal infections like fluconazole  and ketoconazole Medications for irregular heartbeat like amiodarone, flecainide and propafenone Rifampin Warfarin This list may not describe all possible interactions. Give your health care provider a list of all the medicines, herbs, non-prescription drugs, or dietary supplements you use. Also tell them if you smoke, drink alcohol, or use illegal drugs. Some items may interact with your medicine. What should I watch for while using this medication? Visit your care team for regular checks on your progress. You may need blood work while you are taking this medication. It may be some time before you see the benefit from this medication. This medication may cause serious skin reactions. They can happen weeks to months after starting the medication. Contact your care team right away if you notice fevers or flu-like symptoms with a rash. The rash may be red or purple and then turn into blisters or peeling of the skin. Or, you might notice a red rash with swelling of the face, lips or lymph nodes in your neck or under your arms. This medication can make you more sensitive to the sun. Keep out of the sun, If you cannot avoid being in the sun, wear protective clothing and sunscreen. Do not use sun lamps or tanning beds/booths. What side effects may I notice from receiving this medication? Side effects that you should report to your care team as soon as possible: Allergic reactions--skin rash, itching, hives, swelling of the face, lips, tongue, or throat Change in sense of smell Change in taste Infection--fever, chills, cough, or sore throat Liver injury--right upper belly pain, loss of appetite, nausea,  light-colored stool, dark yellow or brown urine, yellowing skin or eyes, unusual weakness or fatigue Low red blood cell level--unusual weakness or fatigue, dizziness, headache, trouble breathing Lupus-like syndrome--joint pain, swelling, or stiffness, butterfly-shaped rash on the face, rashes that get worse  in the sun, fever, unusual weakness or fatigue Rash, fever, and swollen lymph nodes Redness, blistering, peeling, or loosening of the skin, including inside the mouth Unusual bruising or bleeding Worsening mood, feelings of depression Side effects that usually do not require medical attention (report to your care team if they continue or are bothersome): Diarrhea Gas Headache Nausea Stomach pain Upset stomach This list may not describe all possible side effects. Call your doctor for medical advice about side effects. You may report side effects to FDA at 1-800-FDA-1088. Where should I keep my medication? Keep out of the reach of children and pets. Store between 20 and 25 degrees C (68 and 77 degrees F). Protect from light. Get rid of any unused medication after the expiration date. To get rid of medications that are no longer needed or have expired: Take the medication to a medication take-back program. Check with your pharmacy or law enforcement to find a location. If you cannot return the medication, check the label or package insert to see if the medication should be thrown out in the garbage or flushed down the toilet. If you are not sure, ask your care team. If it is safe to put it in the trash, take the medication out of the container. Mix the medication with cat litter, dirt, coffee grounds, or other unwanted substance. Seal the mixture in a bag or container. Put it in the trash. NOTE: This sheet is a summary. It may not cover all possible information. If you have questions about this medicine, talk to your doctor, pharmacist, or health care provider.  2022 Elsevier/Gold Standard (2021-02-25 00:00:00)

## 2021-09-23 ENCOUNTER — Ambulatory Visit: Payer: PPO | Admitting: Internal Medicine

## 2021-09-23 NOTE — Progress Notes (Signed)
Presents today for follow-up of his pathology results for his toenails.  Objective: Pathology results demonstrate Trichophyton rubrum, onychomycosis.  Assessment: Onychomycosis.  Plan: Discussed pros and cons of topical therapy laser therapy and oral therapy.  At this point he chose oral therapy we discussed possible side effects and consequences associated with this drug he understands and is amendable to it.  He has recently had blood work done consisting of a CMP which demonstrates normal BUN/creatinine AST and ALT levels.  We started him on Lamisil 250 mg tablets 1 p.o. daily.  I will follow-up with him in 1 month after 30 tablets for more blood work.  Should he have questions or concerns he will notify us immediately.

## 2021-09-25 ENCOUNTER — Encounter (HOSPITAL_COMMUNITY): Payer: Self-pay | Admitting: Cardiology

## 2021-09-28 ENCOUNTER — Telehealth: Payer: Self-pay | Admitting: *Deleted

## 2021-09-28 NOTE — Telephone Encounter (Signed)
Patient notified of in lab sleep study appointment. ?

## 2021-10-02 NOTE — Addendum Note (Signed)
Addended by: Berniece Salines on: 10/02/2021 02:19 PM   Modules accepted: Orders

## 2021-10-05 ENCOUNTER — Other Ambulatory Visit (HOSPITAL_COMMUNITY): Payer: Self-pay | Admitting: Cardiology

## 2021-10-05 ENCOUNTER — Encounter (HOSPITAL_COMMUNITY)
Admission: RE | Disposition: A | Discharge status: Home / Self Care | Payer: Self-pay | Source: Home / Self Care | Attending: Cardiology

## 2021-10-05 ENCOUNTER — Telehealth: Payer: Self-pay | Admitting: Cardiovascular Disease

## 2021-10-05 ENCOUNTER — Other Ambulatory Visit: Payer: Self-pay

## 2021-10-05 ENCOUNTER — Ambulatory Visit (HOSPITAL_COMMUNITY): Payer: PPO | Admitting: Anesthesiology

## 2021-10-05 ENCOUNTER — Ambulatory Visit (HOSPITAL_COMMUNITY)
Admission: RE | Admit: 2021-10-05 | Discharge: 2021-10-05 | Disposition: A | Discharge status: Home / Self Care | Payer: PPO | Attending: Cardiology | Admitting: Cardiology

## 2021-10-05 ENCOUNTER — Ambulatory Visit (HOSPITAL_BASED_OUTPATIENT_CLINIC_OR_DEPARTMENT_OTHER): Payer: PPO | Admitting: Anesthesiology

## 2021-10-05 DIAGNOSIS — Z8249 Family history of ischemic heart disease and other diseases of the circulatory system: Secondary | ICD-10-CM | POA: Diagnosis not present

## 2021-10-05 DIAGNOSIS — R0789 Other chest pain: Secondary | ICD-10-CM | POA: Insufficient documentation

## 2021-10-05 DIAGNOSIS — I10 Essential (primary) hypertension: Secondary | ICD-10-CM | POA: Insufficient documentation

## 2021-10-05 DIAGNOSIS — Z7901 Long term (current) use of anticoagulants: Secondary | ICD-10-CM | POA: Diagnosis not present

## 2021-10-05 DIAGNOSIS — Z79899 Other long term (current) drug therapy: Secondary | ICD-10-CM | POA: Insufficient documentation

## 2021-10-05 DIAGNOSIS — I4891 Unspecified atrial fibrillation: Secondary | ICD-10-CM

## 2021-10-05 DIAGNOSIS — E782 Mixed hyperlipidemia: Secondary | ICD-10-CM | POA: Diagnosis not present

## 2021-10-05 DIAGNOSIS — R7303 Prediabetes: Secondary | ICD-10-CM | POA: Diagnosis not present

## 2021-10-05 DIAGNOSIS — I480 Paroxysmal atrial fibrillation: Secondary | ICD-10-CM | POA: Diagnosis not present

## 2021-10-05 DIAGNOSIS — R5383 Other fatigue: Secondary | ICD-10-CM | POA: Diagnosis not present

## 2021-10-05 DIAGNOSIS — R008 Other abnormalities of heart beat: Secondary | ICD-10-CM

## 2021-10-05 DIAGNOSIS — I4819 Other persistent atrial fibrillation: Secondary | ICD-10-CM | POA: Diagnosis not present

## 2021-10-05 DIAGNOSIS — E559 Vitamin D deficiency, unspecified: Secondary | ICD-10-CM | POA: Insufficient documentation

## 2021-10-05 SURGERY — CARDIOVERSION
Anesthesia: Monitor Anesthesia Care

## 2021-10-05 MED ORDER — PROPOFOL 10 MG/ML IV BOLUS
INTRAVENOUS | Status: DC | PRN
  Administered 2021-10-05: 80 mg via INTRAVENOUS
  Administered 2021-10-05: 20 mg via INTRAVENOUS
  Administered 2021-10-05: 50 mg via INTRAVENOUS

## 2021-10-05 MED ORDER — SODIUM CHLORIDE 0.9 % IV SOLN: INTRAVENOUS | Status: DC | PRN

## 2021-10-05 MED ORDER — LIDOCAINE 2% (20 MG/ML) 5 ML SYRINGE
INTRAMUSCULAR | Status: DC | PRN
Start: 2021-10-05 — End: 2021-10-05
  Administered 2021-10-05: 100 mg via INTRAVENOUS

## 2021-10-05 NOTE — Anesthesia Preprocedure Evaluation (Addendum)
Anesthesia Evaluation  ?Patient identified by MRN, date of birth, ID band ?Patient awake ? ? ? ?Reviewed: ?Allergy & Precautions, NPO status , Patient's Chart, lab work & pertinent test results ? ?Airway ?Mallampati: II ? ?TM Distance: >3 FB ?Neck ROM: Full ? ? ? Dental ?no notable dental hx. ?(+) Dental Advisory Given, Teeth Intact ?  ?Pulmonary ?neg pulmonary ROS,  ?  ?Pulmonary exam normal ?breath sounds clear to auscultation ? ? ? ? ? ? Cardiovascular ?hypertension, Pt. on medications and Pt. on home beta blockers ?Normal cardiovascular exam ?Rhythm:Regular Rate:Normal ? ? ?  ?Neuro/Psych ?PSYCHIATRIC DISORDERS  Neuromuscular disease   ? GI/Hepatic ?negative GI ROS, Neg liver ROS,   ?Endo/Other  ?negative endocrine ROS ? Renal/GU ?negative Renal ROS  ? ?  ?Musculoskeletal ?negative musculoskeletal ROS ?(+)  ? Abdominal ?  ?Peds ? Hematology ?negative hematology ROS ?(+)   ?Anesthesia Other Findings ? ? Reproductive/Obstetrics ? ?  ? ? ? ? ? ? ? ? ? ? ? ? ? ?  ?  ? ? ? ? ? ? ? ?Anesthesia Physical ?Anesthesia Plan ? ?ASA: 3 ? ?Anesthesia Plan: General  ? ?Post-op Pain Management: Minimal or no pain anticipated  ? ?Induction: Intravenous ? ?PONV Risk Score and Plan: 2 and Propofol infusion, Treatment may vary due to age or medical condition and TIVA ? ?Airway Management Planned: Mask and Natural Airway ? ?Additional Equipment:  ? ?Intra-op Plan:  ? ?Post-operative Plan:  ? ?Informed Consent: I have reviewed the patients History and Physical, chart, labs and discussed the procedure including the risks, benefits and alternatives for the proposed anesthesia with the patient or authorized representative who has indicated his/her understanding and acceptance.  ? ? ? ?Dental advisory given ? ?Plan Discussed with: CRNA ? ?Anesthesia Plan Comments:   ? ? ? ?Anesthesia Quick Evaluation ? ?

## 2021-10-05 NOTE — Transfer of Care (Signed)
Immediate Anesthesia Transfer of Care Note ? ?Patient: Frank Hunt ? ?Procedure(s) Performed: CARDIOVERSION ? ?Patient Location: Endoscopy Unit ? ?Anesthesia Type:General ? ?Level of Consciousness: awake, alert  and oriented ? ?Airway & Oxygen Therapy: Patient Spontanous Breathing ? ?Post-op Assessment: Report given to RN and Post -op Vital signs reviewed and stable ? ?Post vital signs: Reviewed and stable ? ?Last Vitals:  ?Vitals Value Taken Time  ?BP    ?Temp    ?Pulse 79 10/05/21 1028  ?Resp 24 10/05/21 1028  ?SpO2 95 % 10/05/21 1028  ?Vitals shown include unvalidated device data. ? ?Last Pain:  ?Vitals:  ? 10/05/21 0932  ?TempSrc: Temporal  ?PainSc: 0-No pain  ?   ? ?  ? ?Complications: No notable events documented. ?

## 2021-10-05 NOTE — Anesthesia Procedure Notes (Signed)
Procedure Name: General with mask airway ?Date/Time: 10/05/2021 10:15 AM ?Performed by: Griffin Dakin, CRNA ?Pre-anesthesia Checklist: Patient identified, Emergency Drugs available, Suction available, Patient being monitored and Timeout performed ?Patient Re-evaluated:Patient Re-evaluated prior to induction ?Oxygen Delivery Method: Ambu bag ?Preoxygenation: Pre-oxygenation with 100% oxygen ?Induction Type: IV induction ?Placement Confirmation: positive ETCO2 and breath sounds checked- equal and bilateral ?Dental Injury: Teeth and Oropharynx as per pre-operative assessment  ? ? ? ? ?

## 2021-10-05 NOTE — CV Procedure (Signed)
Procedure:   DCCV ? ?Indication:  Symptomatic atrial fibrillation ? ?Procedure Note:  The patient signed informed consent.  They have had had therapeutic anticoagulation with Eliquis greater than 3 weeks.  Anesthesia was administered by Dr. Lissa Hoard and Viann Fish, CRNA.  Adequate airway was maintained throughout and vital followed per protocol.  They were cardioverted x 3 with 200J of biphasic synchronized energy.   ?Cardioversion was not successful.  After first 200J shock, remained in Afib.  For second 200J shock, pressure was applied to anterior pad but remained in Afib.  For third 200J shock, anterior pad was repositioned and pressure applied, but remained in Afib. ? ?There were no apparent complications.  The patient had normal neuro status and respiratory status post procedure with vitals stable as recorded elsewhere.   ? ?Follow up:  They will continue on current medical therapy and follow up with cardiology as scheduled. ? ?Oswaldo Milian, MD ?10/05/2021 ?10:44 AM  ? ?

## 2021-10-05 NOTE — Telephone Encounter (Signed)
Patient is calling stating the RN Judson Roch advised him to callback after his cardioversion to setup an appointment to see Dr. Burt Knack. He advised this is something Dr. Burt Knack already approved, scheduling is unable to setup appointment due to being advised Dr. Burt Knack no longer accepts NP's or provider switches for general. Please advise.  ?

## 2021-10-05 NOTE — Telephone Encounter (Signed)
Pt states that cardioversion today was unsuccessful. Informed that an ablation is likely next step, but no additional follow-up provided per pt. ? ?Appt scheduled for 3/29 '@1'$ :20 with Dr Burt Knack ?

## 2021-10-05 NOTE — Interval H&P Note (Signed)
History and Physical Interval Note: ? ?10/05/2021 ?10:14 AM ? ?Frank Hunt  has presented today for surgery, with the diagnosis of AFIB.  The various methods of treatment have been discussed with the patient and family. After consideration of risks, benefits and other options for treatment, the patient has consented to  Procedure(s): ?TRANSESOPHAGEAL ECHOCARDIOGRAM (TEE) (N/A) ?CARDIOVERSION (N/A) as a surgical intervention.  The patient's history has been reviewed, patient examined, no change in status, stable for surgery.  I have reviewed the patient's chart and labs.  Questions were answered to the patient's satisfaction.   ? ? ?Donato Heinz ? ? ?

## 2021-10-06 ENCOUNTER — Encounter (HOSPITAL_COMMUNITY): Payer: Self-pay | Admitting: Cardiology

## 2021-10-06 NOTE — Anesthesia Postprocedure Evaluation (Signed)
Anesthesia Post Note ? ?Patient: Frank Hunt ? ?Procedure(s) Performed: CARDIOVERSION ? ?  ? ?Patient location during evaluation: Endoscopy ?Anesthesia Type: General ?Level of consciousness: sedated and patient cooperative ?Pain management: pain level controlled ?Vital Signs Assessment: post-procedure vital signs reviewed and stable ?Respiratory status: spontaneous breathing ?Cardiovascular status: stable ?Anesthetic complications: no ? ? ?No notable events documented. ? ?Last Vitals:  ?Vitals:  ? 10/05/21 1040 10/05/21 1050  ?BP: (!) 111/59 100/68  ?Pulse: 63 64  ?Resp: (!) 21 17  ?Temp:    ?SpO2: 98% 99%  ?  ?Last Pain:  ?Vitals:  ? 10/05/21 1050  ?TempSrc:   ?PainSc: 0-No pain  ? ? ?  ?  ?  ?  ?  ?  ? ?Nolon Nations ? ? ? ? ?

## 2021-10-15 ENCOUNTER — Other Ambulatory Visit: Payer: Self-pay

## 2021-10-15 ENCOUNTER — Ambulatory Visit: Payer: PPO

## 2021-10-15 ENCOUNTER — Other Ambulatory Visit: Payer: Self-pay | Admitting: Internal Medicine

## 2021-10-15 DIAGNOSIS — E039 Hypothyroidism, unspecified: Secondary | ICD-10-CM | POA: Diagnosis not present

## 2021-10-15 DIAGNOSIS — R5383 Other fatigue: Secondary | ICD-10-CM

## 2021-10-15 DIAGNOSIS — Z79899 Other long term (current) drug therapy: Secondary | ICD-10-CM | POA: Diagnosis not present

## 2021-10-15 DIAGNOSIS — E349 Endocrine disorder, unspecified: Secondary | ICD-10-CM | POA: Diagnosis not present

## 2021-10-15 DIAGNOSIS — I10 Essential (primary) hypertension: Secondary | ICD-10-CM

## 2021-10-15 DIAGNOSIS — I4819 Other persistent atrial fibrillation: Secondary | ICD-10-CM

## 2021-10-16 LAB — CBC WITH DIFFERENTIAL/PLATELET
Absolute Monocytes: 586 {cells}/uL (ref 200–950)
Basophils Absolute: 41 {cells}/uL (ref 0–200)
Basophils Relative: 0.7 %
Eosinophils Absolute: 162 {cells}/uL (ref 15–500)
Eosinophils Relative: 2.8 %
HCT: 49.6 % (ref 38.5–50.0)
Hemoglobin: 16.6 g/dL (ref 13.2–17.1)
Lymphs Abs: 2030 {cells}/uL (ref 850–3900)
MCH: 31 pg (ref 27.0–33.0)
MCHC: 33.5 g/dL (ref 32.0–36.0)
MCV: 92.5 fL (ref 80.0–100.0)
MPV: 10.2 fL (ref 7.5–12.5)
Monocytes Relative: 10.1 %
Neutro Abs: 2981 {cells}/uL (ref 1500–7800)
Neutrophils Relative %: 51.4 %
Platelets: 157 10*3/uL (ref 140–400)
RBC: 5.36 Million/uL (ref 4.20–5.80)
RDW: 12.2 % (ref 11.0–15.0)
Total Lymphocyte: 35 %
WBC: 5.8 10*3/uL (ref 3.8–10.8)

## 2021-10-16 LAB — COMPLETE METABOLIC PANEL WITH GFR
AG Ratio: 1.8 (calc) (ref 1.0–2.5)
ALT: 36 U/L (ref 9–46)
AST: 27 U/L (ref 10–35)
Albumin: 4.3 g/dL (ref 3.6–5.1)
Alkaline phosphatase (APISO): 64 U/L (ref 35–144)
BUN: 20 mg/dL (ref 7–25)
CO2: 27 mmol/L (ref 20–32)
Calcium: 9.5 mg/dL (ref 8.6–10.3)
Chloride: 105 mmol/L (ref 98–110)
Creat: 1.16 mg/dL (ref 0.70–1.35)
Globulin: 2.4 g/dL (calc) (ref 1.9–3.7)
Glucose, Bld: 91 mg/dL (ref 65–99)
Potassium: 3.9 mmol/L (ref 3.5–5.3)
Sodium: 141 mmol/L (ref 135–146)
Total Bilirubin: 0.6 mg/dL (ref 0.2–1.2)
Total Protein: 6.7 g/dL (ref 6.1–8.1)
eGFR: 69 mL/min/{1.73_m2} (ref >=60)

## 2021-10-16 LAB — TSH: TSH: 4.49 mIU/L (ref 0.40–4.50)

## 2021-10-16 LAB — TESTOSTERONE: Testosterone: 306 ng/dL (ref 250–827)

## 2021-10-16 NOTE — Progress Notes (Signed)
<><><><><><><><><><><><><><><><><><><><><><><><><><><><><><><><><> ?<><><><><><><><><><><><><><><><><><><><><><><><><><><><><><><><><> ?-   Test results slightly outside the reference range are not unusual. ?If there is anything important, I will review this with you,  ?otherwise it is considered normal test values.  ?If you have further questions,  ?please do not hesitate to contact me at the office or via My Chart.  ?<><><><><><><><><><><><><><><><><><><><><><><><><><><><><><><><><> ?<><><><><><><><><><><><><><><><><><><><><><><><><><><><><><><><><> ? ?- CBC - Normal  ?<><><><><><><><><><><><><><><><><><><><><><><><><><><><><><><><><> ?<><><><><><><><><><><><><><><><><><><><><><><><><><><><><><><><><> ? ?- Testosterone is lo , but is in normal range as testosterone levels decrease with age ! ?<><><><><><><><><><><><><><><><><><><><><><><><><><><><><><><><><> ?<><><><><><><><><><><><><><><><><><><><><><><><><><><><><><><><><> ? ?- Thyroid is Normal , but by some criteria is slightly low,  ?                                                  but would not start low dose thyroid til Afib is "fixed" ? ?<><><><><><><><><><><><><><><><><><><><><><><><><><><><><><><><><> ?<><><><><><><><><><><><><><><><><><><><><><><><><><><><><><><><><> ? ?- All Else - CBC - Kidneys - Electrolytes - Liver - Magnesium & Thyroid   ? ?- all  Normal / OK ?<><><><><><><><><><><><><><><><><><><><><><><><><><><><><><><><><> ?<><><><><><><><><><><><><><><><><><><><><><><><><><><><><><><><><> ? ?- Atrial Fib causes about a 20% decrease in your cardiac output and  ?                                                                       would explain your complaints of Fatigue ? ?<><><><><><><><><><><><><><><><><><><><><><><><><><><><><><><><><> ?<><><><><><><><><><><><><><><><><><><><><><><><><><><><><><><><><> ? ? ? ? ? ? ? ? ? ? ? ? ? ? ? ? ? ? ? ?

## 2021-10-21 ENCOUNTER — Encounter: Payer: Self-pay | Admitting: Cardiovascular Disease

## 2021-10-21 ENCOUNTER — Ambulatory Visit (INDEPENDENT_AMBULATORY_CARE_PROVIDER_SITE_OTHER): Payer: PPO | Admitting: Cardiovascular Disease

## 2021-10-21 VITALS — BP 126/80 | HR 61 | Ht 71.0 in | Wt 182.6 lb

## 2021-10-21 DIAGNOSIS — I4819 Other persistent atrial fibrillation: Secondary | ICD-10-CM | POA: Diagnosis not present

## 2021-10-21 NOTE — Progress Notes (Signed)
?Cardiology Office Note:   ? ?Date:  10/21/2021  ? ?ID:  Frank Hunt, DOB [birth year], MRN [id number] ? ?PCP:  Unk Pinto, MD ?  ?[facility] Providers ?Cardiologist:  Berniece Salines, DO    ? ?Referring MD: Unk Pinto, MD  ? ?Chief Complaint  ?Patient presents with  ? Atrial Fibrillation  ? ? ?History of Present Illness:   ? ?Frank Hunt is a 66 y.o. male with a hx of atrial fibrillation, presenting for follow-up evaluation.  Comorbid medical conditions include hypertension, hyperlipidemia, prediabetes, and family history of premature CAD in his brother who had an MI at age 68. ?The patient was recently diagnosed with atrial fibrillation.  Cardioversion was arranged but the patient failed cardioversion.  3 shocks were delivered but he remained in atrial fibrillation throughout.  To date, LVEF is only been assessed by nuclear scan which showed an ejection fraction of 41%. ? ?The patient is here with his wife today.  He reports that he was first diagnosed with atrial fibrillation in January 2023.  At that time he had felt fatigue and exertional dyspnea.  He has symptoms of lightheadedness at times.  He has not had frank syncope.  He has not had any recent chest pain or pressure.  He denies orthopnea, PND, or leg swelling.  He still engages in exercise, but reports that his exercise tolerance is not what it was last year.  As recently as last year, he was able to play full court basketball. ? ?Past Medical History:  ?Diagnosis Date  ? Family history of premature CAD   ? Hyperlipidemia   ? Hypertension   ? PAF (paroxysmal atrial fibrillation) ([place])   ? Prediabetes   ? ? ?Past Surgical History:  ?Procedure Laterality Date  ? CARDIOVERSION N/A 10/05/2021  ? Procedure: CARDIOVERSION;  Surgeon: Donato Heinz, MD;  Location: [facility];  Service: Cardiovascular;  Laterality: N/A;  ? CATARACT EXTRACTION W/ INTRAOCULAR LENS IMPLANT Left 03/26/2013  ? Dr. Herbert Deaner  ? ? ?Current Medications: ?Current Meds   ?Medication Sig  ? apixaban (ELIQUIS) 5 MG TABS tablet Take  1 tablet  2 x /day every 12 hours ) to prevent Blood Clots  ? bisoprolol (ZEBETA) 5 MG tablet Take  1/2 to 1 tablet  every Morning  for BP (Patient taking differently: Take 7.5 mg by mouth daily.)  ? diltiazem (TIAZAC) 240 MG 24 hr capsule Take  1 capsule  Daily  for BP  & Heart Rhythm (Patient taking differently: Take 240 mg by mouth every evening. Take  1 capsule  Daily  for BP  & Heart Rhythm)  ? nitroGLYCERIN (NITROSTAT) 0.4 MG SL tablet Place 1 tablet (0.4 mg total) under the tongue every 5 (five) minutes as needed for chest pain.  ? sildenafil (VIAGRA) 100 MG tablet TAKE 1 TABLET  DAILY AS NEEDED FOR XXXX  ?  ? ?Allergies:   Patient has no known allergies.  ? ?Social History  ? ?Socioeconomic History  ? Marital status: Married  ?  Spouse name: Not on file  ? Number of children: Not on file  ? Years of education: Not on file  ? Highest education level: Not on file  ?Occupational History  ? Not on file  ?Tobacco Use  ? Smoking status: Never  ? Smokeless tobacco: Never  ?Substance and Sexual Activity  ? Alcohol use: No  ? Drug use: Not on file  ? Sexual activity: Not on file  ?Other Topics Concern  ? Not on file  ?  Social History Narrative  ? Not on file  ? ?Social Determinants of Health  ? ?Financial Resource Strain: Not on file  ?Food Insecurity: Not on file  ?Transportation Needs: Not on file  ?Physical Activity: Not on file  ?Stress: Not on file  ?Social Connections: Not on file  ?  ? ?Family History: ?The patient's family history includes Heart attack in his brother. ? ?ROS:   ?Please see the history of present illness.    ?All other systems reviewed and are negative. ? ?EKGs/Labs/Other Studies Reviewed:   ? ?The following studies were reviewed today: ?Myoview Stress Test 09/18/2021: ?  The study is intermediate risk based upon reduction in ejection fraction, 41% EF ?  No ST deviation was noted. ?  Left ventricular function is abnormal. Nuclear  stress EF: 41 %. The left ventricular ejection fraction is moderately decreased (30-44%). End diastolic cavity size is normal. End systolic cavity size is normal. ?  Small apical perfusion defect seen at both rest and stress.  No significant ischemia identified. ? ?Event Monitor: ?Patch Wear Time:  3 days and 7 hours (2023-02-12T09:39:31-0500 to 2023-02-15T16:44:37-0500) ?  ?Atrial Fibrillation occurred continuously (100% burden), ranging from 35-176 bpm (avg of 78 bpm). 9 Pauses occurred, the longest lasting 3.4 secs (18 bpm). Isolated VEs were rare (<1.0%), VE Couplets were rare (<1.0%), and no VE Triplets were present.  ?  ?Conclusion: Continuous atrial fibrillation, noted pauses appreciated however not significant in the setting of atrial fibrillation. ? ?EKG:  EKG is not ordered today.   ? ?Recent Labs: ?09/15/2021: Magnesium 1.7 ?10/15/2021: ALT 36; BUN 20; Creat 1.16; Hemoglobin 16.6; Platelets 157; Potassium 3.9; Sodium 141; TSH 4.49  ?Recent Lipid Panel ?   ?Component Value Date/Time  ? CHOL 222 (H) 08/18/2021 1557  ? TRIG 147 08/18/2021 1557  ? HDL 68 08/18/2021 1557  ? CHOLHDL 3.3 08/18/2021 1557  ? VLDL 24 10/15/2016 1301  ? [place] 128 (H) 08/18/2021 1557  ? ? ? ?Risk Assessment/Calculations:   ? ?CHA2DS2-VASc Score = 2  ? This indicates a 2.2% annual risk of stroke. ?The patient's score is based upon: ?CHF History: 0 ?HTN History: 1 ?Diabetes History: 0 ?Stroke History: 0 ?Vascular Disease History: 0 ?Age Score: 1 ?Gender Score: 0 ?  ? ? ?    ? ?Physical Exam:   ? ?VS:  BP 126/80   Pulse 61   Ht '5\' 11"'$  (1.803 m)   Wt 182 lb 9.6 oz (82.8 kg)   SpO2 94%   BMI 25.47 kg/m?    ? ?Wt Readings from Last 3 Encounters:  ?10/21/21 182 lb 9.6 oz (82.8 kg)  ?09/18/21 185 lb (83.9 kg)  ?09/15/21 185 lb 12.8 oz (84.3 kg)  ?  ? ?GEN:  Well nourished, well developed in no acute distress ?HEENT: Normal ?NECK: No JVD; No carotid bruits ?LYMPHATICS: No lymphadenopathy ?CARDIAC: irregularly irregular, no murmurs, rubs,  gallops ?RESPIRATORY:  Clear to auscultation without rales, wheezing or rhonchi  ?ABDOMEN: Soft, non-tender, non-distended ?MUSCULOSKELETAL:  No edema; No deformity  ?SKIN: Warm and dry ?NEUROLOGIC:  Alert and oriented x 3 ?PSYCHIATRIC:  Normal affect  ? ?ASSESSMENT:   ? ?1. Persistent atrial fibrillation ([place])   ? ?PLAN:   ? ?In order of problems listed above: ? ?The patient has persistent atrial fibrillation now for about 3 months.  He has undergone cardioversion but this failed with inability to cardiovert even with 3 attempts at 200 J.  The patient's LVEF is reduced based on his nuclear  stress test and there was no significant ischemia identified.  I discussed the diagnosis of persistent atrial fibrillation with the patient and his wife today.  We reviewed the natural history of atrial fibrillation.  He understands there is no cure, but this can be managed in the presence of symptoms with either antiarrhythmic drug therapy or catheter ablation.  He will continue on apixaban for oral anticoagulation.  I will order an echocardiogram to evaluate LVEF, presence of valvular disease, and atrial size.  I will refer him for EP consultation with either Dr. Curt Bears or Dr. Quentin Ore to discuss catheter ablation which may be a good option for this otherwise healthy gentleman.  He does not seem keen on long-term antiarrhythmic drug therapy. ? ?   ?Medication Adjustments/Labs and Tests Ordered: ?Current medicines are reviewed at length with the patient today.  Concerns regarding medicines are outlined above.  ?Orders Placed This Encounter  ?Procedures  ? Ambulatory referral to Cardiac Electrophysiology  ? ECHOCARDIOGRAM COMPLETE  ? ?No orders of the defined types were placed in this encounter. ? ? ?Patient Instructions  ?Medication Instructions:  ?Your physician recommends that you continue on your current medications as directed. Please refer to the Current Medication list given to you today. ? ?*If you need a refill on your  cardiac medications before your next appointment, please call your pharmacy* ? ? ?Lab Work: ?NONE ?If you have labs (blood work) drawn today and your tests are completely normal, you will receive your results on

## 2021-10-21 NOTE — Patient Instructions (Signed)
Medication Instructions:  ?Your physician recommends that you continue on your current medications as directed. Please refer to the Current Medication list given to you today. ? ?*If you need a refill on your cardiac medications before your next appointment, please call your pharmacy* ? ? ?Lab Work: ?NONE ?If you have labs (blood work) drawn today and your tests are completely normal, you will receive your results only by: ?MyChart Message (if you have MyChart) OR ?A paper copy in the mail ?If you have any lab test that is abnormal or we need to change your treatment, we will call you to review the results. ? ? ?Testing/Procedures: ?EP consult for A-fib ablation ? ? ?Follow-Up: ?At [facility], you and your health needs are our priority.  As part of our continuing mission to provide you with exceptional heart care, we have created designated Provider Care Teams.  These Care Teams include your primary Cardiologist (physician) and Advanced Practice Providers (APPs -  Physician Assistants and Nurse Practitioners) who all work together to provide you with the care you need, when you need it. ? ?Your next appointment:   ?1 year(s) ? ?The format for your next appointment:   ?In Person ? ?Provider:   ?Sherren Mocha  ? ?  ?

## 2021-10-25 ENCOUNTER — Other Ambulatory Visit: Payer: Self-pay | Admitting: Internal Medicine

## 2021-10-25 DIAGNOSIS — I480 Paroxysmal atrial fibrillation: Secondary | ICD-10-CM

## 2021-10-25 MED ORDER — APIXABAN 5 MG PO TABS: ORAL_TABLET | ORAL | 3 refills | Status: DC

## 2021-10-26 NOTE — Progress Notes (Signed)
MEDICARE ANNUAL WELLNESS VISIT AND FOLLOW UP ?Assessment:  ? ?Frank Hunt was seen today for medicare wellness. ? ?Diagnoses and all orders for this visit: ? ?Encounter for annual wellness visit (AWV) in Medicare patient ?Due Yearly ? ?Persistent atrial fibrillation ([place]) ?Continue medication( Eliquis 5 mg BID, Bisoprolol 7.5 mg daily, diltiazem 240 mg daily) and follow with cardiology ?Planning ablation ? ?Essential hypertension ?- continue medications, DASH diet, exercise and monitor at home. Call if greater than 130/80.   ?Go to the ER if any chest pain, shortness of breath, nausea, dizziness, severe HA, changes vision/speech  ? ?Hypothyroidism, unspecified type ?Continue monitoring, no current medication ? ?Testosterone deficiency ?Currently not on supplementation ? ?Medication management ?Continued ? ?Hyperlipidemia, mixed ?Continue low saturated fat diet ?Continue exercise as tolerated ?Monitor ? ?Abnormal glucose ?Continue diet and exercise ? ?Vitamin D deficiency ?Encouraged Vit D supplementation to maintain value in therapeutic level of 60-100  ? ?Erectile dysfunction ?Continue sildenafil 100 mg as needed ? ?Screening for colon cancer ?-     Cologuard ? ?  ? ?Over 30 minutes of exam, counseling, chart review, and critical decision making was performed ? ?Future Appointments  ?Date Time Provider [place]  ?10/28/2021  8:00 PM Troy Sine, MD [facility]  ?11/10/2021  1:00 PM [facility] 4 [id number]  ?12/07/2021 10:00 AM Constance Haw, MD [facility]  ?10/26/2022  3:00 PM Magda Bernheim, NP [facility] None  ? ? ? ?Plan:  ? ?During the course of the visit the patient was educated and counseled about appropriate screening and preventive services including:  ? ?Pneumococcal vaccine  ?Influenza vaccine ?Prevnar 13 ?Td vaccine ?Screening electrocardiogram ?Colorectal cancer screening ?Diabetes screening ?Glaucoma screening ?Nutrition counseling  ? ? ?Subjective:  ?Frank Hunt is a 66 y.o. male who presents for Medicare Annual Wellness Visit and 3 month follow up. He has ADD (attention deficit disorder); Hyperlipidemia, mixed; Prediabetes; Vitamin D deficiency; Low back pain with right-sided sciatica; Essential hypertension; Body mass index (BMI) 28.0-28.9, adult; Chronic low back pain; Elevated blood-pressure reading, without diagnosis of hypertension; Pain in thumb joint with movement of left hand; Lumbar radiculopathy; Spondylolisthesis; PAF (paroxysmal atrial fibrillation) ([place]); and Persistent atrial fibrillation ([place]) on their problem list. ? ? ?BMI is Body mass index is 25.16 kg/m?., he has been working on diet and exercise. ?Wt Readings from Last 3 Encounters:  ?10/27/21 180 lb 6.4 oz (81.8 kg)  ?10/21/21 182 lb 9.6 oz (82.8 kg)  ?09/18/21 185 lb (83.9 kg)  ? ?He was evaluated by Dr. Burt Knack 10/21/21 ?The patient has persistent atrial fibrillation now for about 3 months.  He has undergone cardioversion but this failed with inability to cardiovert even with 3 attempts at 200 J.  The patient's LVEF is reduced based on his nuclear stress test and there was no significant ischemia identified.  I discussed the diagnosis of persistent atrial fibrillation with the patient and his wife today.  We reviewed the natural history of atrial fibrillation.  He understands there is no cure, but this can be managed in the presence of symptoms with either antiarrhythmic drug therapy or catheter ablation.  He will continue on apixaban for oral anticoagulation.  I will order an echocardiogram to evaluate LVEF, presence of valvular disease, and atrial size.  I will refer him for EP consultation with either Dr. Curt Bears or Dr. Quentin Ore to discuss catheter ablation which may be a good option for this otherwise healthy gentleman.  He does not seem keen on long-term  antiarrhythmic drug therapy. ? ? ?His blood pressure has been controlled at home, today their BP is BP: 118/70  ?BP Readings from Last 3  Encounters:  ?10/27/21 118/70  ?10/21/21 126/80  ?10/05/21 100/68  ?  ?He does workout. He denies chest pain, shortness of breath, dizziness.  ?He is not on cholesterol medication and denies myalgias. His cholesterol is not at goal. The cholesterol last visit was:   ?Lab Results  ?Component Value Date  ? CHOL 222 (H) 08/18/2021  ? HDL 68 08/18/2021  ? [place] 128 (H) 08/18/2021  ? TRIG 147 08/18/2021  ? CHOLHDL 3.3 08/18/2021  ? ?He has been working on diet and exercise for abnormal glucose. Last A1C in the office was:  ?Lab Results  ?Component Value Date  ? HGBA1C 5.9 (H) 08/18/2021  ? ?Last GFR ?Lab Results  ?Component Value Date  ? EGFR 69 10/15/2021  ? ?Patient is on Vitamin D supplement.   ?Lab Results  ?Component Value Date  ? VD25OH 77 08/18/2021  ?   ? ?Medication Review: ? ? ?Current Outpatient Medications (Cardiovascular):  ?  bisoprolol (ZEBETA) 5 MG tablet, Take  1/2 to 1 tablet  every Morning  for BP (Patient taking differently: Take 7.5 mg by mouth daily.) ?  diltiazem (TIAZAC) 240 MG 24 hr capsule, Take  1 capsule  Daily  for BP  & Heart Rhythm (Patient taking differently: Take 240 mg by mouth every evening. Take  1 capsule  Daily  for BP  & Heart Rhythm) ?  nitroGLYCERIN (NITROSTAT) 0.4 MG SL tablet, Place 1 tablet (0.4 mg total) under the tongue every 5 (five) minutes as needed for chest pain. ?  sildenafil (VIAGRA) 100 MG tablet, TAKE 1 TABLET  DAILY AS NEEDED FOR XXXX ? ? ? ?Current Outpatient Medications (Hematological):  ?  apixaban (ELIQUIS) 5 MG TABS tablet, Take  1 tablet  2 x /day (every 12 hours) to prevent Blood Clots                                       /                         TAKE      BYMOUTH ? ?Current Outpatient Medications (Other):  ?  terbinafine (LAMISIL) 250 MG tablet, Take 1 tablet (250 mg total) by mouth daily. (Patient not taking: Reported on 10/21/2021) ? ?Allergies: ?No Known Allergies ? ?Current Problems (verified) ?has ADD (attention deficit disorder); Hyperlipidemia,  mixed; Prediabetes; Vitamin D deficiency; Low back pain with right-sided sciatica; Essential hypertension; Body mass index (BMI) 28.0-28.9, adult; Chronic low back pain; Elevated blood-pressure reading, without diagnosis of hypertension; Pain in thumb joint with movement of left hand; Lumbar radiculopathy; Spondylolisthesis; PAF (paroxysmal atrial fibrillation) ([place]); and Persistent atrial fibrillation ([place]) on their problem list. ? ?Screening Tests ?Immunization History  ?Administered Date(s) Administered  ? Influenza, High Dose Seasonal PF 10/16/2018  ? PFIZER(Purple Top)SARS-COV-2 Vaccination 09/16/2019, 10/10/2019  ? PPD Test 03/13/2014  ? ?Health Maintenance  ?Topic Date Due  ? COVID-19 Vaccine (3 - Pfizer risk series) 11/12/2021 (Originally 11/07/2019)  ? Zoster Vaccines- Shingrix (1 of 2) 01/26/2022 (Originally 09/02/1974)  ? Pneumonia Vaccine 36+ Years old (1 - PCV) 10/28/2022 (Originally 09/02/2018)  ? COLONOSCOPY (Pts 45-47yr Insurance coverage will need to be confirmed)  10/28/2022 (Originally 10/23/2019)  ? TETANUS/TDAP  10/28/2022 (Originally [birth year]02/1972)  ?  INFLUENZA VACCINE  02/23/2022  ? Hepatitis C Screening  Completed  ? HPV VACCINES  Aged Out  ? ? ?Last colonoscopy: 2011, declines colonoscopy willing to do cologuard ? ?Names of Other Physician/Practitioners you currently use: ?1. [facility] here for primary care ?2. Dr. Herbert Deaner, eye doctor, last visit 2021 ?3. Dr. Radford Pax, dentist, last visit 2022 ? ?Patient Care Team: ?Unk Pinto, MD as PCP - General (Internal Medicine) ?Unk Pinto, MD as Referring Physician (Internal Medicine) ?Sherren Mocha, MD as Consulting Physician (Cardiology) ? ?Surgical: ?He  has a past surgical history that includes Cataract extraction w/ intraocular lens implant (Left, 03/26/2013) and Cardioversion (N/A, 10/05/2021). ?Family ?His family history includes Heart attack in his brother. ?Social history  ?He reports that he has never  smoked. He has never used smokeless tobacco. He reports that he does not drink alcohol. No history on file for drug use. ? ?MEDICARE WELLNESS OBJECTIVES: ?Physical activity: Current Exercise Habits: Home ex

## 2021-10-27 ENCOUNTER — Encounter: Payer: Self-pay | Admitting: Nurse Practitioner

## 2021-10-27 ENCOUNTER — Ambulatory Visit (INDEPENDENT_AMBULATORY_CARE_PROVIDER_SITE_OTHER): Payer: PPO | Admitting: Nurse Practitioner

## 2021-10-27 VITALS — BP 118/70 | HR 76 | Temp 97.3°F | Wt 180.4 lb

## 2021-10-27 DIAGNOSIS — E349 Endocrine disorder, unspecified: Secondary | ICD-10-CM

## 2021-10-27 DIAGNOSIS — I10 Essential (primary) hypertension: Secondary | ICD-10-CM

## 2021-10-27 DIAGNOSIS — I4819 Other persistent atrial fibrillation: Secondary | ICD-10-CM

## 2021-10-27 DIAGNOSIS — E559 Vitamin D deficiency, unspecified: Secondary | ICD-10-CM | POA: Diagnosis not present

## 2021-10-27 DIAGNOSIS — Z0001 Encounter for general adult medical examination with abnormal findings: Secondary | ICD-10-CM

## 2021-10-27 DIAGNOSIS — N138 Other obstructive and reflux uropathy: Secondary | ICD-10-CM

## 2021-10-27 DIAGNOSIS — R7309 Other abnormal glucose: Secondary | ICD-10-CM

## 2021-10-27 DIAGNOSIS — E782 Mixed hyperlipidemia: Secondary | ICD-10-CM

## 2021-10-27 DIAGNOSIS — R6889 Other general symptoms and signs: Secondary | ICD-10-CM | POA: Diagnosis not present

## 2021-10-27 DIAGNOSIS — Z1211 Encounter for screening for malignant neoplasm of colon: Secondary | ICD-10-CM

## 2021-10-27 DIAGNOSIS — Z79899 Other long term (current) drug therapy: Secondary | ICD-10-CM

## 2021-10-27 DIAGNOSIS — E039 Hypothyroidism, unspecified: Secondary | ICD-10-CM

## 2021-10-27 DIAGNOSIS — N401 Enlarged prostate with lower urinary tract symptoms: Secondary | ICD-10-CM | POA: Diagnosis not present

## 2021-10-27 DIAGNOSIS — Z0000 Encounter for general adult medical examination without abnormal findings: Secondary | ICD-10-CM

## 2021-10-28 ENCOUNTER — Ambulatory Visit (HOSPITAL_BASED_OUTPATIENT_CLINIC_OR_DEPARTMENT_OTHER): Payer: PPO | Admitting: Cardiovascular Disease

## 2021-10-28 ENCOUNTER — Ambulatory Visit: Payer: PPO | Admitting: Adult Health

## 2021-11-03 ENCOUNTER — Ambulatory Visit: Payer: PPO | Admitting: Podiatry

## 2021-11-09 DIAGNOSIS — Z1211 Encounter for screening for malignant neoplasm of colon: Secondary | ICD-10-CM | POA: Diagnosis not present

## 2021-11-10 ENCOUNTER — Ambulatory Visit (HOSPITAL_COMMUNITY): Payer: PPO | Attending: Cardiology

## 2021-11-10 DIAGNOSIS — I4819 Other persistent atrial fibrillation: Secondary | ICD-10-CM | POA: Diagnosis not present

## 2021-11-10 LAB — ECHOCARDIOGRAM COMPLETE
Area-P 1/2: 2.86 cm2
P 1/2 time: 695 msec
S' Lateral: 3 cm

## 2021-11-12 ENCOUNTER — Other Ambulatory Visit: Payer: Self-pay | Admitting: Internal Medicine

## 2021-11-12 DIAGNOSIS — N5201 Erectile dysfunction due to arterial insufficiency: Secondary | ICD-10-CM

## 2021-11-12 MED ORDER — TADALAFIL 20 MG PO TABS: ORAL_TABLET | ORAL | 3 refills | Status: DC

## 2021-11-15 ENCOUNTER — Other Ambulatory Visit: Payer: Self-pay | Admitting: Internal Medicine

## 2021-11-15 DIAGNOSIS — I480 Paroxysmal atrial fibrillation: Secondary | ICD-10-CM

## 2021-11-15 DIAGNOSIS — I10 Essential (primary) hypertension: Secondary | ICD-10-CM

## 2021-11-15 MED ORDER — DILTIAZEM HCL ER BEADS 240 MG PO CP24: ORAL_CAPSULE | ORAL | 3 refills | Status: DC

## 2021-11-18 LAB — COLOGUARD: COLOGUARD: NEGATIVE

## 2021-12-01 ENCOUNTER — Ambulatory Visit (HOSPITAL_BASED_OUTPATIENT_CLINIC_OR_DEPARTMENT_OTHER): Payer: PPO | Attending: Cardiology | Admitting: Cardiovascular Disease

## 2021-12-01 DIAGNOSIS — R0902 Hypoxemia: Secondary | ICD-10-CM | POA: Insufficient documentation

## 2021-12-01 DIAGNOSIS — I4891 Unspecified atrial fibrillation: Secondary | ICD-10-CM | POA: Insufficient documentation

## 2021-12-01 DIAGNOSIS — I4819 Other persistent atrial fibrillation: Secondary | ICD-10-CM | POA: Diagnosis not present

## 2021-12-01 DIAGNOSIS — R0683 Snoring: Secondary | ICD-10-CM | POA: Insufficient documentation

## 2021-12-01 DIAGNOSIS — R5383 Other fatigue: Secondary | ICD-10-CM | POA: Insufficient documentation

## 2021-12-07 ENCOUNTER — Encounter: Payer: Self-pay | Admitting: *Deleted

## 2021-12-07 ENCOUNTER — Ambulatory Visit: Payer: PPO | Admitting: Cardiology

## 2021-12-07 ENCOUNTER — Encounter: Payer: Self-pay | Admitting: Cardiology

## 2021-12-07 VITALS — BP 92/60 | HR 56 | Ht 71.0 in | Wt 181.2 lb

## 2021-12-07 DIAGNOSIS — I4819 Other persistent atrial fibrillation: Secondary | ICD-10-CM | POA: Diagnosis not present

## 2021-12-07 DIAGNOSIS — Z01812 Encounter for preprocedural laboratory examination: Secondary | ICD-10-CM | POA: Diagnosis not present

## 2021-12-07 NOTE — Patient Instructions (Signed)
Medication Instructions:  Your physician recommends that you continue on your current medications as directed. Please refer to the Current Medication list given to you today.  *If you need a refill on your cardiac medications before your next appointment, please call your pharmacy*   Lab Work: Pre procedure labs - see procedure instruction letter:  BMP & CBC  If you have labs (blood work) drawn today and your tests are completely normal, you will receive your results only by: MyChart Message (if you have MyChart) OR A paper copy in the mail If you have any lab test that is abnormal or we need to change your treatment, we will call you to review the results.   Testing/Procedures: Your physician has requested that you have cardiac CT within 7 days PRIOR to your ablation. Cardiac computed tomography (CT) is a painless test that uses an x-ray machine to take clear, detailed pictures of your heart.  Please follow instruction below located under "other instructions". You will get a call from our office to schedule the date for this test.  Your physician has recommended that you have an ablation. Catheter ablation is a medical procedure used to treat some cardiac arrhythmias (irregular heartbeats). During catheter ablation, a long, thin, flexible tube is put into a blood vessel in your groin (upper thigh), or neck. This tube is called an ablation catheter. It is then guided to your heart through the blood vessel. Radio frequency waves destroy small areas of heart tissue where abnormal heartbeats may cause an arrhythmia to start. Please follow instruction letter given to you today.   Follow-Up: At [facility], you and your health needs are our priority.  As part of our continuing mission to provide you with exceptional heart care, we have created designated Provider Care Teams.  These Care Teams include your primary Cardiologist (physician) and Advanced Practice Providers (APPs -  Physician  Assistants and Nurse Practitioners) who all work together to provide you with the care you need, when you need it.  Your next appointment:   1 month(s) after your ablation  The format for your next appointment:   In Person  Provider:   AFib clinic   Thank you for choosing [facility]!!   Kadynce Bonds, RN [phone]    Other Instructions  Cardiac Ablation Cardiac ablation is a procedure to destroy (ablate) some heart tissue that is sending bad signals. These bad signals cause problems in heart rhythm. The heart has many areas that make these signals. If there are problems in these areas, they can make the heart beat in a way that is not normal. Destroying some tissues can help make the heart rhythm normal. Tell your doctor about: Any allergies you have. All medicines you are taking. These include vitamins, herbs, eye drops, creams, and over-the-counter medicines. Any problems you or family members have had with medicines that make you fall asleep (anesthetics). Any blood disorders you have. Any surgeries you have had. Any medical conditions you have, such as kidney failure. Whether you are pregnant or may be pregnant. What are the risks? This is a safe procedure. But problems may occur, including: Infection. Bruising and bleeding. Bleeding into the chest. Stroke or blood clots. Damage to nearby areas of your body. Allergies to medicines or dyes. The need for a pacemaker if the normal system is damaged. Failure of the procedure to treat the problem. What happens before the procedure? Medicines Ask your doctor about: Changing or stopping your normal medicines. This is   important. Taking aspirin and ibuprofen. Do not take these medicines unless your doctor tells you to take them. Taking other medicines, vitamins, herbs, and supplements. General instructions Follow instructions from your doctor about what you cannot eat or drink. Plan to have someone take you home  from the hospital or clinic. If you will be going home right after the procedure, plan to have someone with you for 24 hours. Ask your doctor what steps will be taken to prevent infection. What happens during the procedure?  An IV tube will be put into one of your veins. You will be given a medicine to help you relax. The skin on your neck or groin will be numbed. A cut (incision) will be made in your neck or groin. A needle will be put through your cut and into a large vein. A tube (catheter) will be put into the needle. The tube will be moved to your heart. Dye may be put through the tube. This helps your doctor see your heart. Small devices (electrodes) on the tube will send out signals. A type of energy will be used to destroy some heart tissue. The tube will be taken out. Pressure will be held on your cut. This helps stop bleeding. A bandage will be put over your cut. The exact procedure may vary among doctors and hospitals. What happens after the procedure? You will be watched until you leave the hospital or clinic. This includes checking your heart rate, breathing rate, oxygen, and blood pressure. Your cut will be watched for bleeding. You will need to lie still for a few hours. Do not drive for 24 hours or as long as your doctor tells you. Summary Cardiac ablation is a procedure to destroy some heart tissue. This is done to treat heart rhythm problems. Tell your doctor about any medical conditions you may have. Tell him or her about all medicines you are taking to treat them. This is a safe procedure. But problems may occur. These include infection, bruising, bleeding, and damage to nearby areas of your body. Follow what your doctor tells you about food and drink. You may also be told to change or stop some of your medicines. After the procedure, do not drive for 24 hours or as long as your doctor tells you. This information is not intended to replace advice given to you by your  health care provider. Make sure you discuss any questions you have with your health care provider. Document Revised: 06/14/2019 Document Reviewed: 06/14/2019 Elsevier Patient Education  2023 Elsevier Inc.  

## 2021-12-07 NOTE — Progress Notes (Signed)
? ?Electrophysiology Office Note ? ? ?Date:  12/07/2021  ? ?ID:  Frank Hunt, DOB 1956/05/23, MRN [id number] ? ?PCP:  Unk Pinto, MD  ?Cardiologist:  Burt Knack ?Primary Electrophysiologist:  Chanteria Haggard Meredith Leeds, MD   ? ?Chief Complaint: AF ?  ?History of Present Illness: ?Frank Hunt is a 66 y.o. male who is being seen today for the evaluation of AF at the request of Frank Mocha, MD. Presenting today for electrophysiology evaluation. ? ?He has a history significant for atrial fibrillation, hypertension, hyperlipidemia.  He was recently diagnosed with atrial fibrillation.  He had a cardioversion, but failed.  He had 3 cardioversions but did not convert to sinus rhythm. ? ?Atrial fibrillation was diagnosed January 2023.  He felt fatigued and dyspnea with exertion.  He also had intermittent lightheadedness. ? ?Today, he denies symptoms of palpitations, chest pain, orthopnea, PND, lower extremity edema, claudication, dizziness, presyncope, syncope, bleeding, or neurologic sequela. The patient is tolerating medications without difficulties.  ? ? ?Past Medical History:  ?Diagnosis Date  ? Family history of premature CAD   ? Hyperlipidemia   ? Hypertension   ? PAF (paroxysmal atrial fibrillation) ([place])   ? Prediabetes   ? ?Past Surgical History:  ?Procedure Laterality Date  ? CARDIOVERSION N/A 10/05/2021  ? Procedure: CARDIOVERSION;  Surgeon: Donato Heinz, MD;  Location: [place];  Service: Cardiovascular;  Laterality: N/A;  ? CATARACT EXTRACTION W/ INTRAOCULAR LENS IMPLANT Left 03/26/2013  ? Dr. Herbert Deaner  ? ? ? ?Current Outpatient Medications  ?Medication Sig Dispense Refill  ? apixaban (ELIQUIS) 5 MG TABS tablet Take  1 tablet  2 x /day (every 12 hours) to prevent Blood Clots                                       /                         TAKE      BYMOUTH 180 tablet 3  ? bisoprolol (ZEBETA) 5 MG tablet Take  1/2 to 1 tablet  every Morning  for BP (Patient taking differently: Take 1/2 to 1 tablet  every Morning for BP) 90 tablet 3  ? diltiazem (TIAZAC) 240 MG 24 hr capsule Take  1 capsule  Daily  for BP  & Heart Rhythm 90 capsule 3  ? nitroGLYCERIN (NITROSTAT) 0.4 MG SL tablet Place 1 tablet (0.4 mg total) under the tongue every 5 (five) minutes as needed for chest pain. 25 tablet 3  ? sildenafil (VIAGRA) 100 MG tablet TAKE 1 TABLET  DAILY AS NEEDED FOR XXXX 30 tablet 3  ? tadalafil (CIALIS) 20 MG tablet Take  1/2 to 1 tablet  every 2 to 3 days  as needed for  XXXX 30 tablet 3  ? ?No current facility-administered medications for this visit.  ? ? ?Allergies:   Patient has no known allergies.  ? ?Social History:  The patient  reports that he has never smoked. He has never used smokeless tobacco. He reports that he does not drink alcohol.  ? ?Family History:  The patient's family history includes Cancer in his father; Heart attack in his brother.  ? ? ?ROS:  Please see the history of present illness.   Otherwise, review of systems is positive for none.   All other systems are reviewed and negative.  ? ? ?PHYSICAL EXAM: ?VS:  BP  92/60   Pulse (!) 56   Ht '5\' 11"'$  (1.803 m)   Wt 181 lb 3.2 oz (82.2 kg)   SpO2 98%   BMI 25.27 kg/m?  , BMI Body mass index is 25.27 kg/m?. ?GEN: Well nourished, well developed, in no acute distress  ?HEENT: normal  ?Neck: no JVD, carotid bruits, or masses ?Cardiac: irregular; no murmurs, rubs, or gallops,no edema  ?Respiratory:  clear to auscultation bilaterally, normal work of breathing ?GI: soft, nontender, nondistended, + BS ?MS: no deformity or atrophy  ?Skin: warm and dry ?Neuro:  Strength and sensation are intact ?Psych: euthymic mood, full affect ? ?EKG:  EKG is not ordered today. ?Personal review of the ekg ordered 10/15/21 shows atrial fibrillation, rate 108 ? ?Recent Labs: ?09/15/2021: Magnesium 1.7 ?10/15/2021: ALT 36; BUN 20; Creat 1.16; Hemoglobin 16.6; Platelets 157; Potassium 3.9; Sodium 141; TSH 4.49  ? ? ?Lipid Panel  ?   ?Component Value Date/Time  ? CHOL 222 (H)  08/18/2021 1557  ? TRIG 147 08/18/2021 1557  ? HDL 68 08/18/2021 1557  ? CHOLHDL 3.3 08/18/2021 1557  ? VLDL 24 10/15/2016 1301  ? [place] 128 (H) 08/18/2021 1557  ? ? ? ?Wt Readings from Last 3 Encounters:  ?12/07/21 181 lb 3.2 oz (82.2 kg)  ?12/01/21 181 lb (82.1 kg)  ?10/27/21 180 lb 6.4 oz (81.8 kg)  ?  ? ? ?Other studies Reviewed: ?Additional studies/ records that were reviewed today include: TTE 11/10/21  ?Review of the above records today demonstrates:  ? 1. Left ventricular ejection fraction, by estimation, is 50 to 55%. The  ?left ventricle has low normal function. The left ventricle has no regional  ?wall motion abnormalities. Left ventricular diastolic function could not  ?be evaluated.  ? 2. Right ventricular systolic function is normal. The right ventricular  ?size is normal. There is normal pulmonary artery systolic pressure.  ? 3. Left atrial size was mildly dilated.  ? 4. Right atrial size was mildly dilated.  ? 5. The mitral valve is normal in structure. Trivial mitral valve  ?regurgitation. No evidence of mitral stenosis.  ? 6. The aortic valve is tricuspid. Aortic valve regurgitation is trivial.  ?Aortic valve sclerosis/calcification is present, without any evidence of  ?aortic stenosis.  ? 7. Aortic dilatation noted. There is borderline dilatation of the  ?ascending aorta, measuring 38 mm.  ? 8. The inferior vena cava is normal in size with greater than 50%  ?respiratory variability, suggesting right atrial pressure of 3 mmHg.  ? ? ?ASSESSMENT AND PLAN: ? ?1.  Persistent atrial fibrillation: CHA2DS2-VASc of 2.  Currently on diltiazem 240 mg daily, Eliquis 5 mg twice daily, bisoprolol 7.5 mg twice daily.  He unfortunately remains in atrial fibrillation.  He would prefer a rhythm control strategy.  He would like to avoid further medication management.  Due to that, we Ankith Edmonston plan for ablation. ? ?Risk, benefits, and alternatives to EP study and radiofrequency ablation for afib were also discussed in  detail today. These risks include but are not limited to stroke, bleeding, vascular damage, tamponade, perforation, damage to the esophagus, lungs, and other structures, pulmonary vein stenosis, worsening renal function, and death. The patient understands these risk and wishes to proceed.  We Merl Bommarito therefore proceed with catheter ablation at the next available time.  Carto, ICE, anesthesia are requested for the procedure.  Aceyn Kathol also obtain CT PV protocol prior to the procedure to exclude LAA thrombus and further evaluate atrial anatomy.  ? ?2.  Secondary  hypercoagulable state: Currently on Eliquis for atrial fibrillation as above. ? ? ?Current medicines are reviewed at length with the patient today.   ?The patient does not have concerns regarding his medicines.  The following changes were made today:  none ? ?Labs/ tests ordered today include:  ?Orders Placed This Encounter  ?Procedures  ? CT CARDIAC MORPH/PULM VEIN W/CM&W/O CA SCORE  ? Basic metabolic panel  ? CBC  ? ? ? ?Disposition:   FU with Emad Brechtel 3 months ? ?Signed, ?Lashala Laser Meredith Leeds, MD  ?12/07/2021 10:39 AM    ? ?[facility] ?[address] ?[address] ?[place] [address] ?([phone] (office) ?[phone] (fax) ? ?

## 2021-12-08 ENCOUNTER — Ambulatory Visit: Payer: PPO | Admitting: Cardiology

## 2021-12-10 ENCOUNTER — Encounter (HOSPITAL_BASED_OUTPATIENT_CLINIC_OR_DEPARTMENT_OTHER): Payer: Self-pay | Admitting: Cardiovascular Disease

## 2021-12-10 NOTE — Procedures (Signed)
     Patient Name: Frank Hunt, Born Date: 12/01/2023 Gender: Male D.O.B: 12-10-1955 Age (years): 48 Referring Provider: Godfrey Pick Tobb DO Height (inches): 71 Interpreting Physician: Shelva Majestic MD, ABSM Weight (lbs): 181 RPSGT: Zadie Rhine BMI: 25 MRN: [id number] Neck Size: 15.50  CLINICAL INFORMATION Sleep Study Type: NPSG  Indication for sleep study: Fatigue, atrial fibrillation, snoring  Epworth Sleepiness Score: 1  SLEEP STUDY TECHNIQUE As per the AASM Manual for the Scoring of Sleep and Associated Events v2.3 (April 2016) with a hypopnea requiring 4% desaturations.  The channels recorded and monitored were frontal, central and occipital EEG, electrooculogram (EOG), submentalis EMG (chin), nasal and oral airflow, thoracic and abdominal wall motion, anterior tibialis EMG, snore microphone, electrocardiogram, and pulse oximetry.  MEDICATIONS apixaban (ELIQUIS) 5 MG TABS tablet bisoprolol (ZEBETA) 5 MG tablet diltiazem (TIAZAC) 240 MG 24 hr capsule nitroGLYCERIN (NITROSTAT) 0.4 MG SL tablet sildenafil (VIAGRA) 100 MG tablet tadalafil (CIALIS) 20 MG tablet Medications self-administered by patient taken the night of the study : [place] The study was initiated at 10:27:16 PM and ended at 4:36:10 AM.  Sleep onset time was 27.4 minutes and the sleep efficiency was 78.2%%. The total sleep time was 288.5 minutes.  Stage REM latency was 106.0 minutes.  The patient spent 11.6%% of the night in stage N1 sleep, 64.3%% in stage N2 sleep, 0.0%% in stage N3 and 24.1% in REM.  Alpha intrusion was absent.  Supine sleep was 4.51%.  RESPIRATORY PARAMETERS The overall apnea/hypopnea index (AHI) was 0.0 per hour. The respiratory disturbance index (RDI) was 0. There were 0 total apneas, including 0 obstructive, 0 central and 0 mixed apneas. There were 0 hypopneas and 0 RERAs.  The AHI during Stage REM sleep was 0.0 per hour.  AHI while supine was 0.0 per  hour.  The mean oxygen saturation was 94.5%. The minimum SpO2 during sleep was 89.0%.  Moderate snoring was noted during this study.  CARDIAC DATA The 2 lead EKG demonstrated atrial fibrillation. The mean heart rate was 68.1 beats per minute. Other EKG findings include: None.  LEG MOVEMENT DATA The total PLMS were 0 with a resulting PLMS index of 0.0. Associated arousal with leg movement index was 4.8 .  IMPRESSIONS - No significant obstructive sleep apnea occurred during this study (AHI 0.0/h; RDI 0). - The patient had minimal oxygen desaturation to a nadir of 89%. - The patient snored with moderate snoring volume. - No cardiac abnormalities were noted during this study. - Clinically significant periodic limb movements did not occur during sleep. No significant associated arousals.  DIAGNOSIS - Snoring - Nocturnal Hypoxemia (G47.36) - Atrial fibrillation  RECOMMENDATIONS - There is no indication for CPAP therapy. - Effort should be made to optimize nasal and orophayngeal patency. - Consider alternative therapy for moderate snoring. - Avoid alcohol, sedatives and other CNS depressants that may worsen sleep apnea and disrupt normal sleep architecture. - Sleep hygiene should be reviewed to assess factors that may improve sleep quality. - Weight management and regular exercise should be initiated or continued if appropriate.  [Electronically signed] 12/10/2021 03:09 PM  Shelva Majestic MD, [facility], [place], American Board of Sleep Medicine  NPI: [id number]  [place] PH: [phone]   FX: [phone] [place]

## 2021-12-15 ENCOUNTER — Telehealth: Payer: Self-pay | Admitting: *Deleted

## 2021-12-15 NOTE — Telephone Encounter (Signed)
Sleep study results sent to patient via my chart. 

## 2021-12-15 NOTE — Telephone Encounter (Signed)
-----   Message from Troy Sine, MD sent at 12/10/2021  3:14 PM EDT ----- Mariann Laster, please notify pt of there results.

## 2021-12-22 ENCOUNTER — Encounter: Payer: Self-pay | Admitting: Internal Medicine

## 2021-12-22 ENCOUNTER — Ambulatory Visit (INDEPENDENT_AMBULATORY_CARE_PROVIDER_SITE_OTHER): Payer: PPO | Admitting: Internal Medicine

## 2021-12-22 VITALS — BP 130/70 | HR 56 | Temp 97.9°F | Resp 16 | Ht 71.0 in | Wt 180.0 lb

## 2021-12-22 DIAGNOSIS — D485 Neoplasm of uncertain behavior of skin: Secondary | ICD-10-CM

## 2021-12-22 DIAGNOSIS — I10 Essential (primary) hypertension: Secondary | ICD-10-CM

## 2021-12-22 DIAGNOSIS — L820 Inflamed seborrheic keratosis: Secondary | ICD-10-CM

## 2021-12-22 DIAGNOSIS — C44622 Squamous cell carcinoma of skin of right upper limb, including shoulder: Secondary | ICD-10-CM | POA: Diagnosis not present

## 2021-12-22 NOTE — Progress Notes (Signed)
Future Appointments  Date Time Provider [place]  02/08/2022  8:45 AM [facility]  02/17/2022  1:30 PM [facility] 2 [facility]  10/26/2022  3:00 PM Mull, Townsend Roger, NP [facility] None    History of Present Illness:      Medications   Current Outpatient Medications (Cardiovascular):    bisoprolol (ZEBETA) 5 MG tablet, Take  1/2 to 1 tablet  every Morning  for BP (Patient taking differently: Take 1/2 to 1 tablet every Morning for BP)   diltiazem (TIAZAC) 240 MG 24 hr capsule, Take  1 capsule  Daily  for BP  & Heart Rhythm   sildenafil (VIAGRA) 100 MG tablet, TAKE 1 TABLET  DAILY AS NEEDED FOR XXXX   tadalafil (CIALIS) 20 MG tablet, Take  1/2 to 1 tablet  every 2 to 3 days  as needed for  XXXX   nitroGLYCERIN (NITROSTAT) 0.4 MG SL tablet, Place 1 tablet (0.4 mg total) under the tongue every 5 (five) minutes as needed for chest pain.    Current Outpatient Medications (Hematological):    apixaban (ELIQUIS) 5 MG TABS tablet, Take  1 tablet  2 x /day (every 12 hours) to prevent Blood Clots                                       /                         TAKE      BYMOUTH   Problem list He has ADD (attention deficit disorder); Hyperlipidemia, mixed; Prediabetes; Vitamin D deficiency; Low back pain with right-sided sciatica; Essential hypertension; Body mass index (BMI) 28.0-28.9, adult; Chronic low back pain; Elevated blood-pressure reading, without diagnosis of hypertension; Pain in thumb joint with movement of left hand; Lumbar radiculopathy; Spondylolisthesis; PAF (paroxysmal atrial fibrillation) (HCC); and Persistent atrial fibrillation (HCC) on their problem list.   Observations/Objective:  BP 92/60   Pulse (!) 56   Temp 97.9 F (36.6 C)   Resp 16   Ht '5\' 11"'$  (1.803 m)   Wt 180 lb (81.6 kg)   SpO2 98%   BMI 25.10 kg/m   Skin focused exam finds on the left posterior mid flank , an irritated  15 x 12 mm polypoid inflamed seborrheic keratosis that  is partially torn from its supporting base  Procedure    (CPT  17000)      After informed consent and aseptic prep with alcohol, the lesion was locally anesthetized with 1 ml Marcaine 0.5% sq & intradermal. Then with a #10 scalpel ,the lesion was excised by shave technique & the wound base was hyfrecated for hemostasis. Antibiotic & sterile dressing was applied & covered with a 3' x 3"  Tegaderm. Patient was instructed in wound   Assessment and Plan:  1. Essential hypertension   2. Seborrheic keratoses, inflamed  - Excised & Hyfrecated base.    Follow Up Instructions:       I discussed the assessment and treatment plan with the patient. The patient was provided an opportunity to ask questions and all were answered. The patient agreed with the plan and demonstrated an understanding of the instructions.       The patient was advised to call back or seek an in-person evaluation if the symptoms worsen or if the condition fails to improve as anticipated.  Kirtland Bouchard, MD

## 2022-01-15 IMAGING — CT CT ABD-PELV W/ CM
2 of 5 series · 17 of 46 positions shown, 19 images · IV contrast (APPLIED)
Comparison: None.

CLINICAL DATA: CT abdomen and pelvis dated February 26, 2021.

EXAM:
CT ABDOMEN AND PELVIS WITH CONTRAST
TECHNIQUE: Multidetector CT imaging of the abdomen and pelvis was performed
using the standard protocol following bolus administration of
intravenous contrast.
CONTRAST:  80mL OMNIPAQUE IOHEXOL 300 MG/ML  SOLN

[Series 2: abd pel w · axial · 0.73mm/px · z∈[+884,+1244]mm · 14 of 82 slices shown, 16 images]
[im 5/82  soft-tissue]
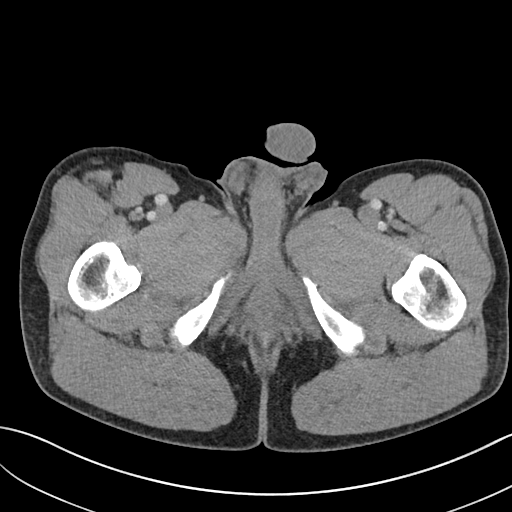
[im 5/82  bone]
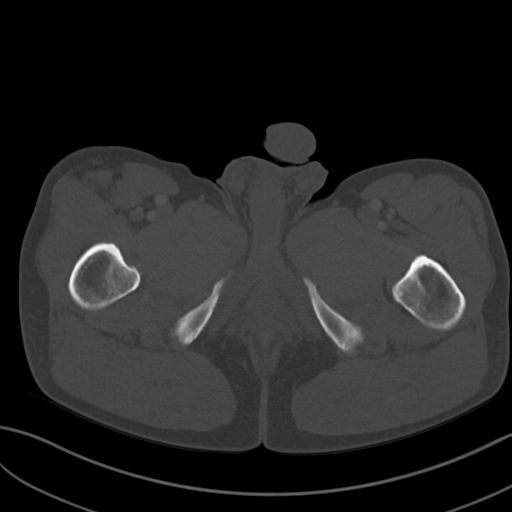
[im 10/82  soft-tissue]
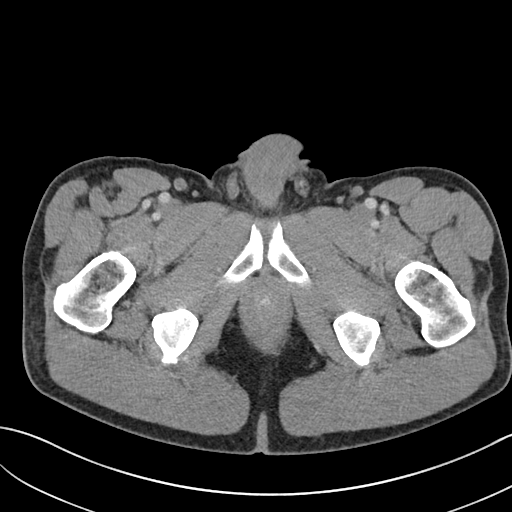
[im 19/82  soft-tissue]
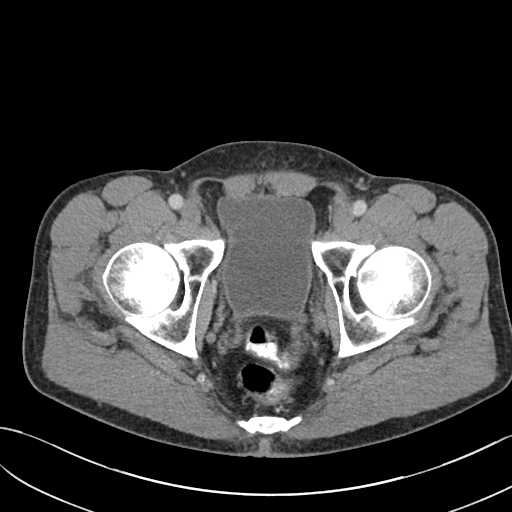
[im 23/82  soft-tissue]
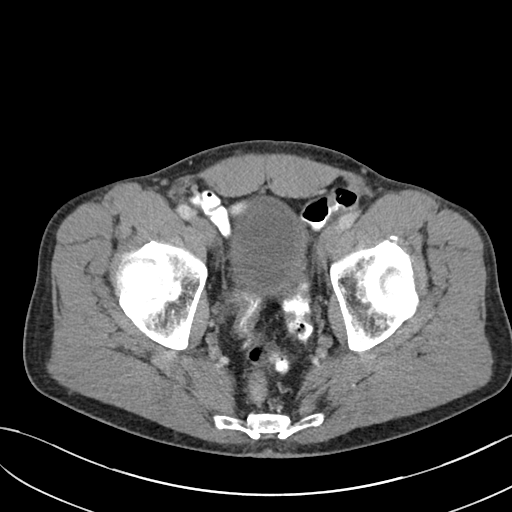
[im 28/82  soft-tissue]
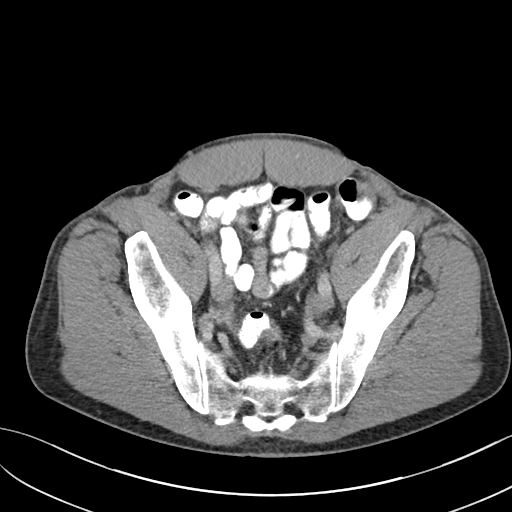
[im 32/82  soft-tissue]
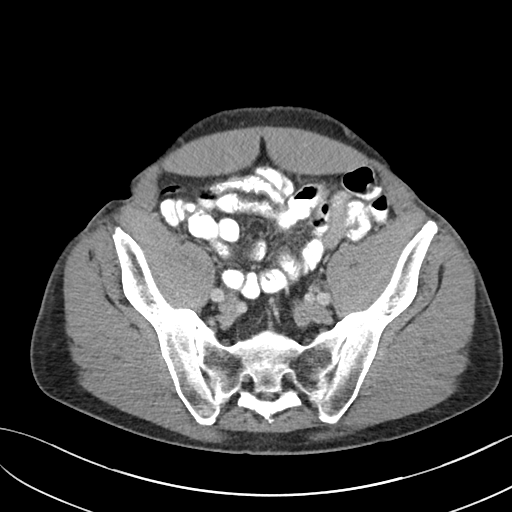
[im 37/82  soft-tissue]
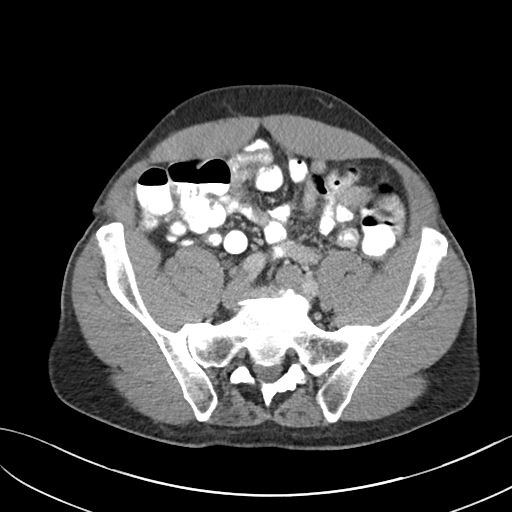
[im 46/82  soft-tissue]
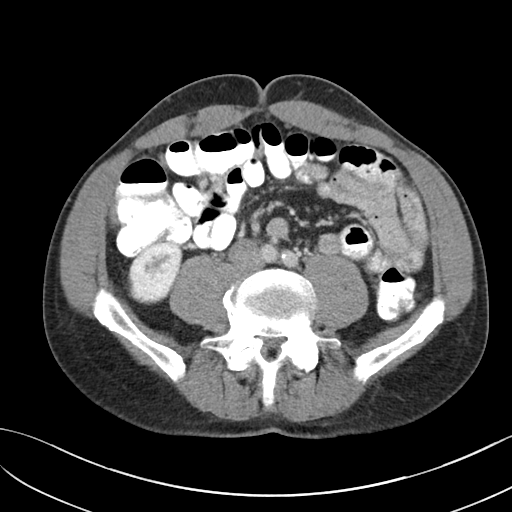
[im 50/82  soft-tissue]
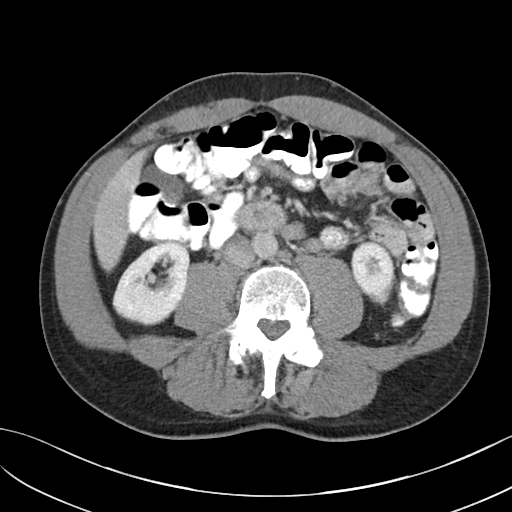
[im 50/82  bone]
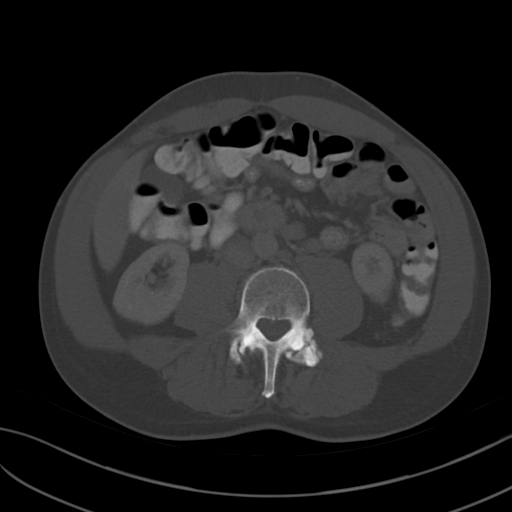
[im 55/82  soft-tissue]
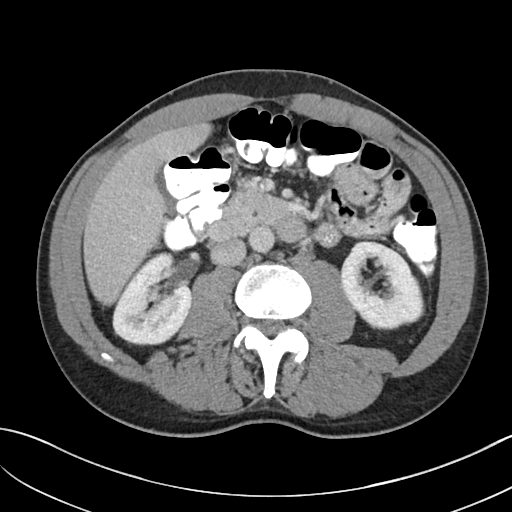
[im 59/82  soft-tissue]
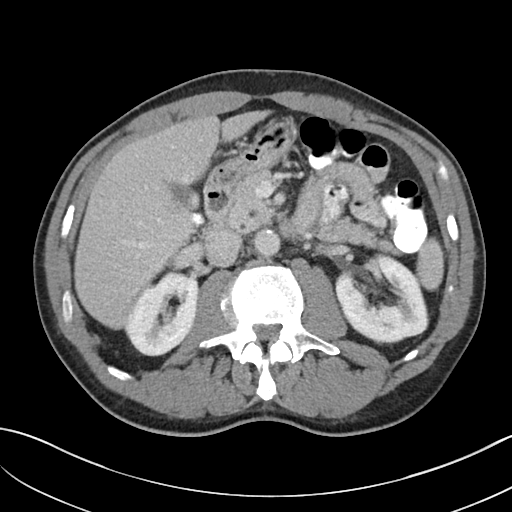
[im 64/82  soft-tissue]
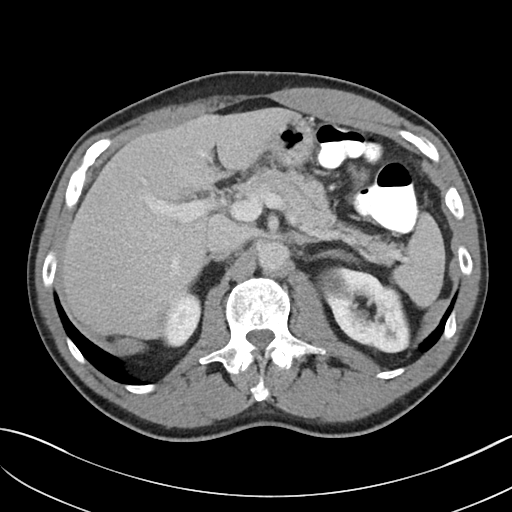
[im 73/82  soft-tissue]
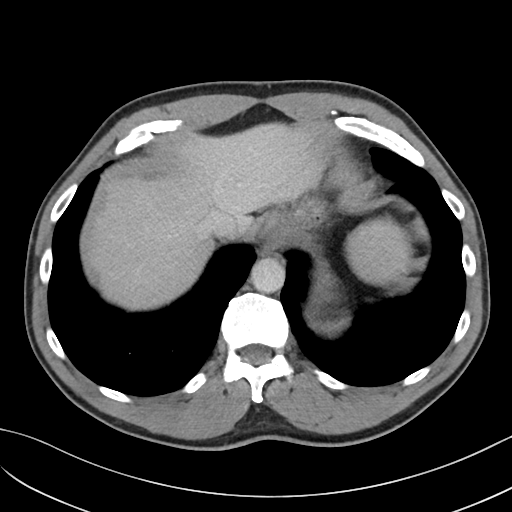
[im 77/82  soft-tissue]
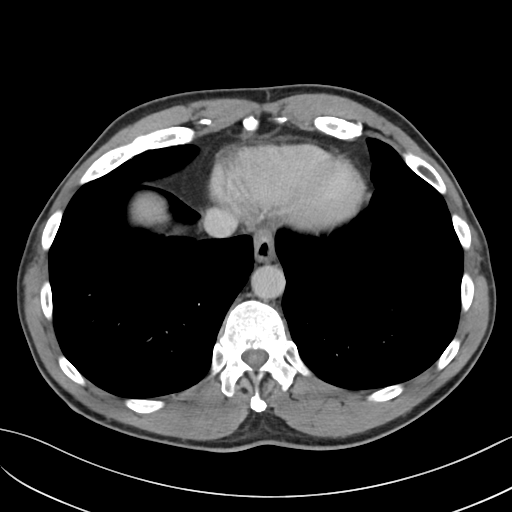

[Series 5: coronal · coronal · 0.72mm/px · 3 of 103 slices shown]
[im 35/103  soft-tissue]
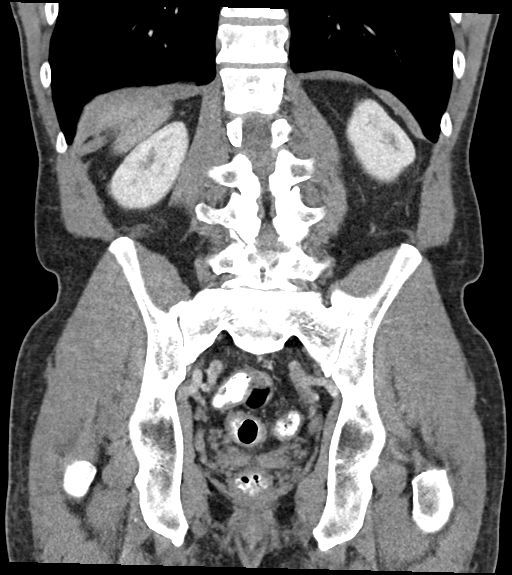
[im 46/103  soft-tissue]
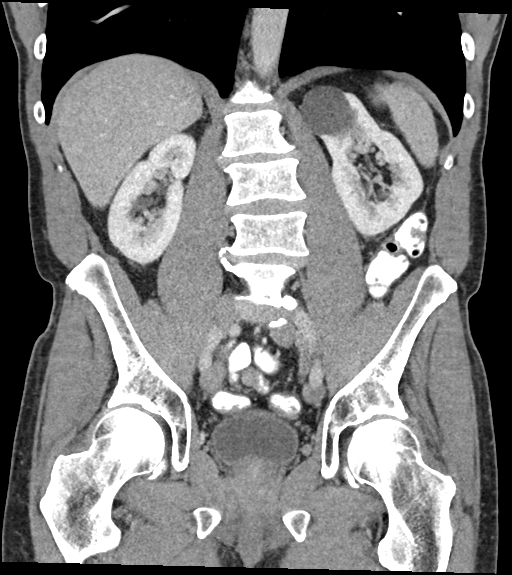
[im 57/103  soft-tissue]
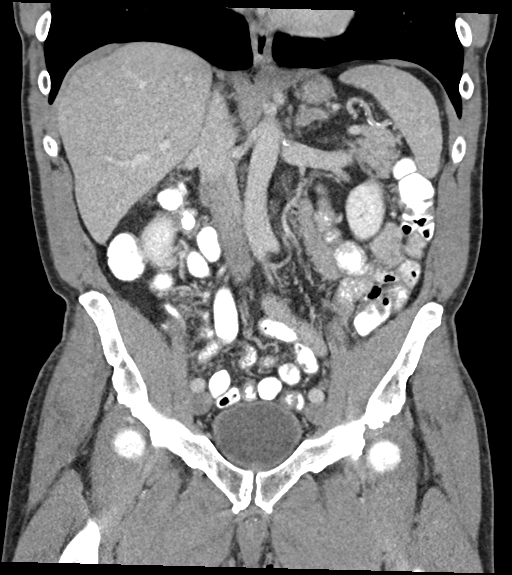

[17 of 46 positions shown; findings below may reference images not displayed]

FINDINGS: Lower chest: No acute abnormality.

Hepatobiliary: No focal liver abnormality is seen. No gallstones,
gallbladder wall thickening, or biliary dilatation.

Pancreas: Unremarkable. No pancreatic ductal dilatation or
surrounding inflammatory changes.

Spleen: Normal in size without focal abnormality.

Adrenals/Urinary Tract: Bilateral adrenal glands are unremarkable.
Kidneys enhance symmetrically with no evidence of hydronephrosis.
Simple cyst of the upper pole of the left kidney. Additional small
bilateral low-density renal lesions are seen which are too small to
completely characterize. Bladder is unremarkable.

Stomach/Bowel: Stomach is within normal limits. Appendix appears
normal. No evidence of bowel wall thickening, distention, or
inflammatory changes.

Vascular/Lymphatic: Aortic atherosclerosis. No enlarged abdominal or
pelvic lymph nodes.

Reproductive: Prostate is unremarkable.

Other: No abdominal wall hernia or abnormality. No abdominopelvic
ascites.

Musculoskeletal: Grade 1 anterolisthesis of L4 on L5, likely related
to facet degenerative change. No aggressive appearing osseous
lesions.
IMPRESSION: No evidence of adrenal tumor.

## 2022-02-08 ENCOUNTER — Other Ambulatory Visit: Payer: PPO

## 2022-02-08 DIAGNOSIS — Z01812 Encounter for preprocedural laboratory examination: Secondary | ICD-10-CM

## 2022-02-08 DIAGNOSIS — I4819 Other persistent atrial fibrillation: Secondary | ICD-10-CM

## 2022-02-08 LAB — CBC
Hematocrit: 47.4 % (ref 37.5–51.0)
Hemoglobin: 16.2 g/dL (ref 13.0–17.7)
MCH: 32 pg (ref 26.6–33.0)
MCHC: 34.2 g/dL (ref 31.5–35.7)
MCV: 94 fL (ref 79–97)
Platelets: 164 10*3/uL (ref 150–450)
RBC: 5.06 x10E6/uL (ref 4.14–5.80)
RDW: 12.8 % (ref 11.6–15.4)
WBC: 4.3 10*3/uL (ref 3.4–10.8)

## 2022-02-08 LAB — BASIC METABOLIC PANEL
BUN/Creatinine Ratio: 17 (ref 10–24)
BUN: 18 mg/dL (ref 8–27)
CO2: 27 mmol/L (ref 20–29)
Calcium: 9.2 mg/dL (ref 8.6–10.2)
Chloride: 103 mmol/L (ref 96–106)
Creatinine, Ser: 1.03 mg/dL (ref 0.76–1.27)
Glucose: 97 mg/dL (ref 70–99)
Potassium: 4.3 mmol/L (ref 3.5–5.2)
Sodium: 137 mmol/L (ref 134–144)
eGFR: 79 mL/min/{1.73_m2} (ref >59)

## 2022-02-12 ENCOUNTER — Other Ambulatory Visit: Payer: Self-pay | Admitting: Internal Medicine

## 2022-02-12 DIAGNOSIS — R0989 Other specified symptoms and signs involving the circulatory and respiratory systems: Secondary | ICD-10-CM

## 2022-02-16 ENCOUNTER — Telehealth (HOSPITAL_COMMUNITY): Payer: Self-pay | Admitting: *Deleted

## 2022-02-16 NOTE — Telephone Encounter (Signed)
Reaching out to patient to offer assistance regarding upcoming cardiac imaging study; pt verbalizes understanding of appt date/time, parking situation and where to check in, pre-test NPO status and verified current allergies; name and call back number provided for further questions should they arise  Gordy Clement RN Navigator Cardiac Imaging [facility] [phone] office [phone] cell  Patient to take his daily medications.  He is aware to arrive at 1pm.

## 2022-02-17 ENCOUNTER — Ambulatory Visit (HOSPITAL_COMMUNITY)
Admission: RE | Admit: 2022-02-17 | Discharge: 2022-02-17 | Disposition: A | Discharge status: Home / Self Care | Payer: PPO | Source: Ambulatory Visit | Attending: Cardiology | Admitting: Cardiology

## 2022-02-17 DIAGNOSIS — I4819 Other persistent atrial fibrillation: Secondary | ICD-10-CM | POA: Diagnosis not present

## 2022-02-17 MED ORDER — IOHEXOL 350 MG/ML SOLN
100.0000 mL | Freq: Once | INTRAVENOUS | Status: AC | PRN
  Administered 2022-02-17: 100 mL via INTRAVENOUS

## 2022-02-19 ENCOUNTER — Telehealth: Payer: Self-pay | Admitting: Cardiology

## 2022-02-19 NOTE — Telephone Encounter (Signed)
Patient calling with question in regards to his appt on 8/1. Please advise

## 2022-02-19 NOTE — Telephone Encounter (Signed)
Pt called back regarding questions he had regarding his A-Fib Ablation scheduled on 02/23/22 with Dr. Elliot Cousin.  Pt wanted to know what time he needed to check in for his procedure; he was told 830 am and was told where to check in at [facility] hospital  admitting.    He also wanted to know when to stop taking his meds.  Per the instruction letter, the patient was told at midnight 7/31 => 8/1, he is NOT to eat or drink anything.  NPO status taught.  Pt also told to take his Eliquis up until the night prior to his ablation.  No meds the morning of his procedure.    Pt understood all instructions, and told to call back if he has any future questions.  No f/u req.

## 2022-02-22 NOTE — Pre-Procedure Instructions (Addendum)
Attempted to call patient regarding procedure instructions.  No answer.  1330- Frank Hunt called back reviewed the instructions with him.  Instructed patient on the following items: Arrival time 0830 Nothing to eat or drink after midnight No meds AM of procedure Responsible person to drive you home and stay with you for 24 hrs  Have you missed any doses of anti-coagulant Eliquis- hasn't missed any doses

## 2022-02-23 ENCOUNTER — Encounter (HOSPITAL_COMMUNITY): Payer: Self-pay | Admitting: Cardiology

## 2022-02-23 ENCOUNTER — Ambulatory Visit (HOSPITAL_BASED_OUTPATIENT_CLINIC_OR_DEPARTMENT_OTHER): Payer: PPO | Admitting: Anesthesiology

## 2022-02-23 ENCOUNTER — Other Ambulatory Visit: Payer: Self-pay

## 2022-02-23 ENCOUNTER — Ambulatory Visit (HOSPITAL_COMMUNITY): Payer: PPO | Admitting: Anesthesiology

## 2022-02-23 ENCOUNTER — Ambulatory Visit (HOSPITAL_COMMUNITY)
Admission: RE | Disposition: A | Discharge status: Home / Self Care | Payer: Self-pay | Source: Home / Self Care | Attending: Cardiology

## 2022-02-23 ENCOUNTER — Ambulatory Visit (HOSPITAL_COMMUNITY)
Admission: RE | Admit: 2022-02-23 | Discharge: 2022-02-23 | Disposition: A | Discharge status: Home / Self Care | Payer: PPO | Attending: Cardiology | Admitting: Cardiology

## 2022-02-23 DIAGNOSIS — I10 Essential (primary) hypertension: Secondary | ICD-10-CM | POA: Insufficient documentation

## 2022-02-23 DIAGNOSIS — I4891 Unspecified atrial fibrillation: Secondary | ICD-10-CM

## 2022-02-23 DIAGNOSIS — E785 Hyperlipidemia, unspecified: Secondary | ICD-10-CM | POA: Diagnosis not present

## 2022-02-23 DIAGNOSIS — I4819 Other persistent atrial fibrillation: Secondary | ICD-10-CM | POA: Diagnosis not present

## 2022-02-23 HISTORY — PX: ATRIAL FIBRILLATION ABLATION: EP1191

## 2022-02-23 LAB — POCT ACTIVATED CLOTTING TIME
Activated Clotting Time: 317 seconds
Activated Clotting Time: 317 seconds

## 2022-02-23 SURGERY — ATRIAL FIBRILLATION ABLATION
Anesthesia: General

## 2022-02-23 MED ORDER — DOBUTAMINE INFUSION FOR EP/ECHO/NUC (1000 MCG/ML)
INTRAVENOUS | Status: DC | PRN
  Administered 2022-02-23: 20 ug/kg/min via INTRAVENOUS

## 2022-02-23 MED ORDER — DEXAMETHASONE SODIUM PHOSPHATE 10 MG/ML IJ SOLN
INTRAMUSCULAR | Status: DC | PRN
  Administered 2022-02-23: 10 mg via INTRAVENOUS

## 2022-02-23 MED ORDER — HEPARIN SODIUM (PORCINE) 1000 UNIT/ML IJ SOLN
INTRAMUSCULAR | Status: DC | PRN
  Administered 2022-02-23: 1000 [IU] via INTRAVENOUS

## 2022-02-23 MED ORDER — PHENYLEPHRINE HCL-NACL 20-0.9 MG/250ML-% IV SOLN
INTRAVENOUS | Status: DC | PRN
  Administered 2022-02-23: 20 ug/min via INTRAVENOUS

## 2022-02-23 MED ORDER — SODIUM CHLORIDE 0.9 % IV SOLN
INTRAVENOUS | Status: DC
Start: 2022-02-23 — End: 2022-02-23

## 2022-02-23 MED ORDER — HEPARIN SODIUM (PORCINE) 1000 UNIT/ML IJ SOLN
INTRAMUSCULAR | Status: DC | PRN
  Administered 2022-02-23: 14000 [IU] via INTRAVENOUS
  Administered 2022-02-23: 2000 [IU] via INTRAVENOUS

## 2022-02-23 MED ORDER — HEPARIN (PORCINE) IN NACL 1000-0.9 UT/500ML-% IV SOLN
INTRAVENOUS | Status: AC
  Filled 2022-02-23: qty 500

## 2022-02-23 MED ORDER — FENTANYL CITRATE (PF) 100 MCG/2ML IJ SOLN
INTRAMUSCULAR | Status: AC
  Filled 2022-02-23: qty 2

## 2022-02-23 MED ORDER — PROTAMINE SULFATE 10 MG/ML IV SOLN
INTRAVENOUS | Status: DC | PRN
  Administered 2022-02-23: 40 mg via INTRAVENOUS

## 2022-02-23 MED ORDER — LIDOCAINE 2% (20 MG/ML) 5 ML SYRINGE
INTRAMUSCULAR | Status: DC | PRN
  Administered 2022-02-23: 40 mg via INTRAVENOUS

## 2022-02-23 MED ORDER — SODIUM CHLORIDE 0.9 % IV SOLN: 250.0000 mL | INTRAVENOUS | Status: DC | PRN

## 2022-02-23 MED ORDER — ONDANSETRON HCL 4 MG/2ML IJ SOLN
INTRAMUSCULAR | Status: DC | PRN
  Administered 2022-02-23: 4 mg via INTRAVENOUS

## 2022-02-23 MED ORDER — ONDANSETRON HCL 4 MG/2ML IJ SOLN: 4.0000 mg | Freq: Four times a day (QID) | INTRAMUSCULAR | Status: DC | PRN

## 2022-02-23 MED ORDER — ROCURONIUM BROMIDE 10 MG/ML (PF) SYRINGE
PREFILLED_SYRINGE | INTRAVENOUS | Status: DC | PRN
  Administered 2022-02-23: 60 mg via INTRAVENOUS

## 2022-02-23 MED ORDER — DOBUTAMINE INFUSION FOR EP/ECHO/NUC (1000 MCG/ML)
INTRAVENOUS | Status: AC
Start: 2022-02-23 — End: ?
  Filled 2022-02-23: qty 250

## 2022-02-23 MED ORDER — PHENYLEPHRINE 80 MCG/ML (10ML) SYRINGE FOR IV PUSH (FOR BLOOD PRESSURE SUPPORT)
PREFILLED_SYRINGE | INTRAVENOUS | Status: DC | PRN
  Administered 2022-02-23: 80 ug via INTRAVENOUS
  Administered 2022-02-23: 240 ug via INTRAVENOUS

## 2022-02-23 MED ORDER — SUGAMMADEX SODIUM 200 MG/2ML IV SOLN
INTRAVENOUS | Status: DC | PRN
  Administered 2022-02-23: 200 mg via INTRAVENOUS

## 2022-02-23 MED ORDER — ACETAMINOPHEN 325 MG PO TABS: 650.0000 mg | ORAL_TABLET | ORAL | Status: DC | PRN

## 2022-02-23 MED ORDER — FENTANYL CITRATE (PF) 250 MCG/5ML IJ SOLN
INTRAMUSCULAR | Status: DC | PRN
Start: 2022-02-23 — End: 2022-02-23
  Administered 2022-02-23: 100 ug via INTRAVENOUS

## 2022-02-23 MED ORDER — SODIUM CHLORIDE 0.9% FLUSH: 3.0000 mL | INTRAVENOUS | Status: DC | PRN

## 2022-02-23 MED ORDER — HEPARIN (PORCINE) IN NACL 1000-0.9 UT/500ML-% IV SOLN
INTRAVENOUS | Status: DC | PRN
  Administered 2022-02-23 (×3): 500 mL

## 2022-02-23 MED ORDER — PROPOFOL 10 MG/ML IV BOLUS
INTRAVENOUS | Status: DC | PRN
  Administered 2022-02-23: 130 mg via INTRAVENOUS

## 2022-02-23 MED ORDER — HEPARIN (PORCINE) IN NACL 2000-0.9 UNIT/L-% IV SOLN
INTRAVENOUS | Status: AC
  Filled 2022-02-23: qty 1000

## 2022-02-23 SURGICAL SUPPLY — 18 items
CATH ABLAT QDOT MICRO BI TC DF (CATHETERS) ×1 IMPLANT
CATH OCTARAY 2.0 F 3-3-3-3-3 (CATHETERS) ×1 IMPLANT
CATH S CIRCA THERM PROBE 10F (CATHETERS) ×1 IMPLANT
CATH SOUNDSTAR ECO 8FR (CATHETERS) ×1 IMPLANT
CATH WEBSTER BI DIR CS D-F CRV (CATHETERS) ×1 IMPLANT
CLOSURE PERCLOSE PROSTYLE (VASCULAR PRODUCTS) ×4 IMPLANT
COVER SWIFTLINK CONNECTOR (BAG) ×2 IMPLANT
KIT VERSACROSS STEERABLE D1 (CATHETERS) ×1 IMPLANT
PACK EP LATEX FREE (CUSTOM PROCEDURE TRAY) ×1
PACK EP LF (CUSTOM PROCEDURE TRAY) ×1 IMPLANT
PAD DEFIB RADIO PHYSIO CONN (PAD) ×2 IMPLANT
PATCH CARTO3 (PAD) ×1 IMPLANT
SHEATH CARTO VIZIGO SM CVD (SHEATH) ×1 IMPLANT
SHEATH PINNACLE 7F 10CM (SHEATH) ×1 IMPLANT
SHEATH PINNACLE 8F 10CM (SHEATH) ×2 IMPLANT
SHEATH PINNACLE 9F 10CM (SHEATH) ×1 IMPLANT
SHEATH PROBE COVER 6X72 (BAG) ×1 IMPLANT
TUBING SMART ABLATE COOLFLOW (TUBING) ×1 IMPLANT

## 2022-02-23 NOTE — H&P (Signed)
Electrophysiology Office Note   Date:  02/23/2022   ID:  Frank Hunt, DOB [birth year], MRN [id number]  PCP:  Unk Pinto, MD  Cardiologist:  Burt Knack Primary Electrophysiologist:  Britten Seyfried Meredith Leeds, MD    Chief Complaint: AF   History of Present Illness: Frank Hunt is a 66 y.o. male who is being seen today for the evaluation of AF at the request of No ref. provider found. Presenting today for electrophysiology evaluation.  He has a history significant for atrial fibrillation, hypertension, hyperlipidemia.  He was recently diagnosed with atrial fibrillation.  He had a cardioversion, but failed.  He had 3 cardioversions but did not convert to sinus rhythm.  Atrial fibrillation was diagnosed January 2023.  He felt fatigued and dyspnea with exertion.  He also had intermittent lightheadedness.  Today, denies symptoms of palpitations, chest pain, shortness of breath, orthopnea, PND, lower extremity edema, claudication, dizziness, presyncope, syncope, bleeding, or neurologic sequela. The patient is tolerating medications without difficulties. Plan AF ablation today.    Past Medical History:  Diagnosis Date   Family history of premature CAD    Hyperlipidemia    Hypertension    PAF (paroxysmal atrial fibrillation) ([place])    Prediabetes    Past Surgical History:  Procedure Laterality Date   CARDIOVERSION N/A 10/05/2021   Procedure: CARDIOVERSION;  Surgeon: Donato Heinz, MD;  Location: [facility];  Service: Cardiovascular;  Laterality: N/A;   CATARACT EXTRACTION W/ INTRAOCULAR LENS IMPLANT Left 03/26/2013   Dr. Herbert Deaner     Current Facility-Administered Medications  Medication Dose Route Frequency Provider Last Rate Last Admin   0.9 %  sodium chloride infusion   Intravenous Continuous Constance Haw, MD 50 mL/hr at 02/23/22 0914 New Bag at 02/23/22 0914    Allergies:   Patient has no known allergies.   Social History:  The patient  reports that he has never  smoked. He has never used smokeless tobacco. He reports that he does not drink alcohol.   Family History:  The patient's family history includes Cancer in his father; Heart attack in his brother.   ROS:  Please see the history of present illness.   Otherwise, review of systems is positive for none.   All other systems are reviewed and negative.   PHYSICAL EXAM: VS:  BP (!) 120/59   Pulse 80   Temp (!) 97.5 F (36.4 C) (Temporal)   Resp 18   Ht '5\' 11"'$  (1.803 m)   Wt 81.6 kg   SpO2 97%   BMI 25.10 kg/m  , BMI Body mass index is 25.1 kg/m. GEN: Well nourished, well developed, in no acute distress  HEENT: normal  Neck: no JVD, carotid bruits, or masses Cardiac: irregular; no murmurs, rubs, or gallops,no edema  Respiratory:  clear to auscultation bilaterally, normal work of breathing GI: soft, nontender, nondistended, + BS MS: no deformity or atrophy  Skin: warm and dry Neuro:  Strength and sensation are intact Psych: euthymic mood, full affect  Recent Labs: 09/15/2021: Magnesium 1.7 10/15/2021: ALT 36; TSH 4.49 02/08/2022: BUN 18; Creatinine, Ser 1.03; Hemoglobin 16.2; Platelets 164; Potassium 4.3; Sodium 137    Lipid Panel     Component Value Date/Time   CHOL 222 (H) 08/18/2021 1557   TRIG 147 08/18/2021 1557   HDL 68 08/18/2021 1557   CHOLHDL 3.3 08/18/2021 1557   VLDL 24 10/15/2016 1301   LDLCALC 128 (H) 08/18/2021 1557     Wt Readings from Last 3 Encounters:  02/23/22 81.6  kg  12/22/21 81.6 kg  12/07/21 82.2 kg      Other studies Reviewed: Additional studies/ records that were reviewed today include: TTE 11/10/21  Review of the above records today demonstrates:   1. Left ventricular ejection fraction, by estimation, is 50 to 55%. The  left ventricle has low normal function. The left ventricle has no regional  wall motion abnormalities. Left ventricular diastolic function could not  be evaluated.   2. Right ventricular systolic function is normal. The right  ventricular  size is normal. There is normal pulmonary artery systolic pressure.   3. Left atrial size was mildly dilated.   4. Right atrial size was mildly dilated.   5. The mitral valve is normal in structure. Trivial mitral valve  regurgitation. No evidence of mitral stenosis.   6. The aortic valve is tricuspid. Aortic valve regurgitation is trivial.  Aortic valve sclerosis/calcification is present, without any evidence of  aortic stenosis.   7. Aortic dilatation noted. There is borderline dilatation of the  ascending aorta, measuring 38 mm.   8. The inferior vena cava is normal in size with greater than 50%  respiratory variability, suggesting right atrial pressure of 3 mmHg.    ASSESSMENT AND PLAN:  1.  Persistent atrial fibrillation: [name] has presented today for surgery, with the diagnosis of AF.  The various methods of treatment have been discussed with the patient and family. After consideration of risks, benefits and other options for treatment, the patient has consented to  Procedure(s): Catheter ablation as a surgical intervention .  Risks include but not limited to complete heart block, stroke, esophageal damage, nerve damage, bleeding, vascular damage, tamponade, perforation, MI, and death. The patient's history has been reviewed, patient examined, no change in status, stable for surgery.  I have reviewed the patient's chart and labs.  Questions were answered to the patient's satisfaction.    Andora Krull Curt Bears, MD 02/23/2022 9:32 AM

## 2022-02-23 NOTE — Anesthesia Preprocedure Evaluation (Addendum)
Anesthesia Evaluation  Patient identified by MRN, date of birth, ID band Patient awake    Reviewed: Allergy & Precautions, NPO status , Patient's Chart, lab work & pertinent test results  Airway Mallampati: I  TM Distance: >3 FB Neck ROM: Full    Dental  (+) Teeth Intact, Dental Advisory Given   Pulmonary neg pulmonary ROS,    breath sounds clear to auscultation       Cardiovascular hypertension, + dysrhythmias Atrial Fibrillation  Rhythm:Irregular Rate:Normal  Echo: 1. Left ventricular ejection fraction, by estimation, is 50 to 55%. The  left ventricle has low normal function. The left ventricle has no regional  wall motion abnormalities. Left ventricular diastolic function could not  be evaluated.  2. Right ventricular systolic function is normal. The right ventricular  size is normal. There is normal pulmonary artery systolic pressure.  3. Left atrial size was mildly dilated.  4. Right atrial size was mildly dilated.  5. The mitral valve is normal in structure. Trivial mitral valve  regurgitation. No evidence of mitral stenosis.  6. The aortic valve is tricuspid. Aortic valve regurgitation is trivial.  Aortic valve sclerosis/calcification is present, without any evidence of  aortic stenosis.  7. Aortic dilatation noted. There is borderline dilatation of the  ascending aorta, measuring 38 mm.  8. The inferior vena cava is normal in size with greater than 50%  respiratory variability, suggesting right atrial pressure of 3 mmHg.   Neuro/Psych    GI/Hepatic negative GI ROS, Neg liver ROS,   Endo/Other  negative endocrine ROS  Renal/GU negative Renal ROS     Musculoskeletal negative musculoskeletal ROS (+)   Abdominal Normal abdominal exam  (+)   Peds  Hematology negative hematology ROS (+)   Anesthesia Other Findings   Reproductive/Obstetrics                           Anesthesia  Physical Anesthesia Plan  ASA: 3  Anesthesia Plan: General   Post-op Pain Management:    Induction: Intravenous  PONV Risk Score and Plan: 2 and Ondansetron and Midazolam  Airway Management Planned: Oral ETT  Additional Equipment: None  Intra-op Plan:   Post-operative Plan: Extubation in OR  Informed Consent: I have reviewed the patients History and Physical, chart, labs and discussed the procedure including the risks, benefits and alternatives for the proposed anesthesia with the patient or authorized representative who has indicated his/her understanding and acceptance.     Dental advisory given  Plan Discussed with: CRNA  Anesthesia Plan Comments:         Anesthesia Quick Evaluation

## 2022-02-23 NOTE — Anesthesia Postprocedure Evaluation (Signed)
Anesthesia Post Note  Patient: Frank Hunt  Procedure(s) Performed: ATRIAL FIBRILLATION ABLATION     Patient location during evaluation: PACU Anesthesia Type: General Level of consciousness: awake and alert Pain management: pain level controlled Vital Signs Assessment: post-procedure vital signs reviewed and stable Respiratory status: spontaneous breathing, nonlabored ventilation, respiratory function stable and patient connected to nasal cannula oxygen Cardiovascular status: blood pressure returned to baseline and stable Postop Assessment: no apparent nausea or vomiting Anesthetic complications: no   There were no known notable events for this encounter.  Last Vitals:  Vitals:   02/23/22 1347 02/23/22 1400  BP: 100/77 106/69  Pulse: 70 69  Resp: 20 19  Temp:    SpO2: 97% 96%    Last Pain:  Vitals:   02/23/22 1258  TempSrc:   PainSc: 0-No pain                 Effie Berkshire

## 2022-02-23 NOTE — Transfer of Care (Signed)
Immediate Anesthesia Transfer of Care Note  Patient: Frank Hunt  Procedure(s) Performed: ATRIAL FIBRILLATION ABLATION  Patient Location: PACU and Cath Lab  Anesthesia Type:General  Level of Consciousness: drowsy and patient cooperative  Airway & Oxygen Therapy: Patient Spontanous Breathing  Post-op Assessment: Report given to RN and Post -op Vital signs reviewed and stable  Post vital signs: Reviewed and stable  Last Vitals:  Vitals Value Taken Time  BP 115/59 02/23/22 1209  Temp 36.6 C 02/23/22 1208  Pulse 66 02/23/22 1210  Resp 13 02/23/22 1210  SpO2 98 % 02/23/22 1210  Vitals shown include unvalidated device data.  Last Pain:  Vitals:   02/23/22 1208  TempSrc: Temporal  PainSc: 0-No pain         Complications: There were no known notable events for this encounter.

## 2022-02-23 NOTE — Anesthesia Procedure Notes (Signed)
Procedure Name: Intubation Date/Time: 02/23/2022 10:19 AM  Performed by: Thelma Comp, CRNAPre-anesthesia Checklist: Patient identified, Emergency Drugs available, Suction available and Patient being monitored Patient Re-evaluated:Patient Re-evaluated prior to induction Oxygen Delivery Method: Circle System Utilized Preoxygenation: Pre-oxygenation with 100% oxygen Induction Type: IV induction Ventilation: Mask ventilation without difficulty Laryngoscope Size: Mac and 4 Grade View: Grade II Tube type: Oral Tube size: 7.5 mm Number of attempts: 1 Airway Equipment and Method: Stylet Placement Confirmation: ETT inserted through vocal cords under direct vision, positive ETCO2 and breath sounds checked- equal and bilateral Secured at: 22 cm Tube secured with: Tape Dental Injury: Teeth and Oropharynx as per pre-operative assessment

## 2022-02-23 NOTE — Discharge Instructions (Addendum)
Post procedure care instructions No driving for 4 days. No lifting over 5 lbs for 1 week. No vigorous or sexual activity for 1 week. You may return to work/your usual activities on 03/03/22. Keep procedure site clean & dry. If you notice increased pain, swelling, bleeding or pus, call/return!  You may shower after 24 hours, but no soaking in baths/hot tubs/pools for 1 week.    You have an appointment set up with the [facility].  Multiple studies have shown that being followed by a dedicated atrial fibrillation clinic in addition to the standard care you receive from your other physicians improves health. We believe that enrollment in the atrial fibrillation clinic will allow [place] to better care for you.   The phone number to the [facility] is [phone]. The clinic is staffed Monday through Friday from 8:30am to 5pm.  Parking Directions: The clinic is located in the Heart and Vascular Building connected to [facility]. 1)From [address] turn on to [address] and go to the 3rd entrance  (Heart and Vascular entrance) on the right. 2)Look to the right for Heart &Vascular Parking Garage. 3)A code for the entrance is required, for August is 1403   4)Take the elevators to the 1st floor. Registration is in the room with the glass walls at the end of the hallway.  If you have any trouble parking or locating the clinic, please don't hesitate to call [phone].   Cardiac Ablation, Care After  This sheet gives you information about how to care for yourself after your procedure. Your health care provider may also give you more specific instructions. If you have problems or questions, contact your health care provider. What can I expect after the procedure? After the procedure, it is common to have: Bruising around your puncture site. Tenderness around your puncture site. Skipped heartbeats. Tiredness (fatigue).  Follow these instructions at  home: Puncture site care  Follow instructions from your health care provider about how to take care of your puncture site. Make sure you: If present, leave stitches (sutures), skin glue, or adhesive strips in place. These skin closures may need to stay in place for up to 2 weeks. If adhesive strip edges start to loosen and curl up, you may trim the loose edges. Do not remove adhesive strips completely unless your health care provider tells you to do that. If a large square bandage is present, this may be removed 24 hours after surgery.  Check your puncture site every day for signs of infection. Check for: Redness, swelling, or pain. Fluid or blood. If your puncture site starts to bleed, lie down on your back, apply firm pressure to the area, and contact your health care provider. Warmth. Pus or a bad smell. A pea or small marble sized lump at the site is normal and can take up to three months to resolve.  Driving Do not drive for at least 4 days after your procedure or however long your health care provider recommends. (Do not resume driving if you have previously been instructed not to drive for other health reasons.) Do not drive or use heavy machinery while taking prescription pain medicine. Activity Avoid activities that take a lot of effort for at least 7 days after your procedure. Do not lift anything that is heavier than 5 lb (4.5 kg) for one week.  No sexual activity for 1 week.  Return to your normal activities as told by your health care provider. Ask your health  care provider what activities are safe for you. General instructions Take over-the-counter and prescription medicines only as told by your health care provider. Do not use any products that contain nicotine or tobacco, such as cigarettes and e-cigarettes. If you need help quitting, ask your health care provider. You may shower after 24 hours, but Do not take baths, swim, or use a hot tub for 1 week.  Do not drink alcohol for  24 hours after your procedure. Keep all follow-up visits as told by your health care provider. This is important. Contact a health care provider if: You have redness, mild swelling, or pain around your puncture site. You have fluid or blood coming from your puncture site that stops after applying firm pressure to the area. Your puncture site feels warm to the touch. You have pus or a bad smell coming from your puncture site. You have a fever. You have chest pain or discomfort that spreads to your neck, jaw, or arm. You are sweating a lot. You feel nauseous. You have a fast or irregular heartbeat. You have shortness of breath. You are dizzy or light-headed and feel the need to lie down. You have pain or numbness in the arm or leg closest to your puncture site. Get help right away if: Your puncture site suddenly swells. Your puncture site is bleeding and the bleeding does not stop after applying firm pressure to the area. These symptoms may represent a serious problem that is an emergency. Do not wait to see if the symptoms will go away. Get medical help right away. Call your local emergency services (911 in the [place] Do not drive yourself to the hospital. Summary After the procedure, it is normal to have bruising and tenderness at the puncture site in your groin, neck, or forearm. Check your puncture site every day for signs of infection. Get help right away if your puncture site is bleeding and the bleeding does not stop after applying firm pressure to the area. This is a medical emergency. This information is not intended to replace advice given to you by your health care provider. Make sure you discuss any questions you have with your health care provider.

## 2022-02-24 ENCOUNTER — Encounter (HOSPITAL_COMMUNITY): Payer: Self-pay | Admitting: Cardiology

## 2022-02-24 ENCOUNTER — Other Ambulatory Visit: Payer: Self-pay | Admitting: Internal Medicine

## 2022-02-24 MED FILL — Heparin Sod (Porcine)-NaCl IV Soln 2000 Unit/L-0.9%: INTRAVENOUS | Qty: 1000 | Status: AC

## 2022-02-24 MED FILL — Fentanyl Citrate Preservative Free (PF) Inj 100 MCG/2ML: INTRAMUSCULAR | Qty: 2 | Status: AC

## 2022-02-24 MED FILL — Heparin Sod (Porcine)-NaCl IV Soln 1000 Unit/500ML-0.9%: INTRAVENOUS | Qty: 500 | Status: AC

## 2022-02-25 ENCOUNTER — Encounter: Payer: Self-pay | Admitting: Cardiology

## 2022-02-28 ENCOUNTER — Other Ambulatory Visit: Payer: Self-pay | Admitting: Internal Medicine

## 2022-02-28 DIAGNOSIS — E782 Mixed hyperlipidemia: Secondary | ICD-10-CM

## 2022-02-28 DIAGNOSIS — I10 Essential (primary) hypertension: Secondary | ICD-10-CM

## 2022-02-28 DIAGNOSIS — I480 Paroxysmal atrial fibrillation: Secondary | ICD-10-CM

## 2022-03-09 ENCOUNTER — Other Ambulatory Visit: Payer: PPO

## 2022-03-09 ENCOUNTER — Other Ambulatory Visit: Payer: Self-pay

## 2022-03-09 DIAGNOSIS — I10 Essential (primary) hypertension: Secondary | ICD-10-CM

## 2022-03-09 DIAGNOSIS — E782 Mixed hyperlipidemia: Secondary | ICD-10-CM

## 2022-03-09 DIAGNOSIS — I480 Paroxysmal atrial fibrillation: Secondary | ICD-10-CM

## 2022-03-12 ENCOUNTER — Other Ambulatory Visit: Payer: PPO

## 2022-03-12 DIAGNOSIS — I480 Paroxysmal atrial fibrillation: Secondary | ICD-10-CM | POA: Diagnosis not present

## 2022-03-12 DIAGNOSIS — E782 Mixed hyperlipidemia: Secondary | ICD-10-CM | POA: Diagnosis not present

## 2022-03-12 DIAGNOSIS — I10 Essential (primary) hypertension: Secondary | ICD-10-CM | POA: Diagnosis not present

## 2022-03-19 ENCOUNTER — Encounter: Payer: Self-pay | Admitting: Internal Medicine

## 2022-03-20 LAB — CARDIO IQ(R) ADVANCED LIPID PANEL
Apolipoprotein B: 73 mg/dL (ref <90)
Cholesterol: 177 mg/dL (ref <200)
HDL LARGE: 7836 nmol/L (ref >6729)
HDL: 66 mg/dL (ref >39)
LDL Cholesterol (Calc): 93 mg/dL (calc) (ref <100)
LDL Medium: 213 nmol/L (ref <215)
LDL Particle Number: 958 nmol/L (ref <1138)
LDL Peak Size: 223.6 Angstrom (ref >222.9)
LDL Small: 116 nmol/L (ref <142)
Lipoprotein (a): 136 nmol/L — ABNORMAL HIGH (ref <75)
Non-HDL Cholesterol (Calc): 111 mg/dL (calc) (ref <130)
Total CHOL/HDL Ratio: 2.7 calc (ref <5.0)
Triglycerides: 90 mg/dL (ref <150)

## 2022-03-21 NOTE — Progress Notes (Signed)
<><><><><><><><><><><><><><><><><><><><><><><><><><><><><><><><><> <><><><><><><><><><><><><><><><><><><><><><><><><><><><><><><><><> -   Test results slightly outside the reference range are not unusual. If there is anything important, I will review this with you,  otherwise it is considered normal test values.  If you have further questions,  please do not hesitate to contact me at the office or via My Chart.  <><><><><><><><><><><><><><><><><><><><><><><><><><><><><><><><><> <><><><><><><><><><><><><><><><><><><><><><><><><><><><><><><><><>  - Cholesterol parameters Much Improved since on a prudent Plant based diet   - Keep up the [place] Work !   As a reminder :  - Cholesterol only comes from animal sources                                                                     - ie. meat, dairy, egg yolks - Eat all the vegetables you want.  - Avoid Meat, Avoid Meat,  Avoid Meat                                                 - especially Red Meat - Beef AND Pork .  - Avoid cheese & dairy - milk & ice cream.     - Cheese is the most concentrated form of trans-fats which                                                  is the worst thing to clog up our arteries.   - Veggie cheese is OK which can be found in the fresh  produce section at [employer] or [employer] or [employer]   Also some studies have shown                        very dramatic results with                                       lowering cholesterol by                                                      eating 3 to 4 [place] nuts 2 to 3 x /week

## 2022-03-22 ENCOUNTER — Other Ambulatory Visit: Payer: Self-pay | Admitting: Internal Medicine

## 2022-03-22 ENCOUNTER — Other Ambulatory Visit: Payer: PPO

## 2022-03-22 DIAGNOSIS — Z79899 Other long term (current) drug therapy: Secondary | ICD-10-CM

## 2022-03-22 DIAGNOSIS — R946 Abnormal results of thyroid function studies: Secondary | ICD-10-CM

## 2022-03-22 DIAGNOSIS — R002 Palpitations: Secondary | ICD-10-CM | POA: Diagnosis not present

## 2022-03-23 ENCOUNTER — Encounter (HOSPITAL_COMMUNITY): Payer: Self-pay | Admitting: Nurse Practitioner

## 2022-03-23 ENCOUNTER — Ambulatory Visit (HOSPITAL_COMMUNITY)
Admission: RE | Admit: 2022-03-23 | Discharge: 2022-03-23 | Disposition: A | Discharge status: Home / Self Care | Payer: PPO | Source: Ambulatory Visit | Attending: Nurse Practitioner | Admitting: Nurse Practitioner

## 2022-03-23 VITALS — BP 122/64 | HR 53 | Ht 71.0 in | Wt 173.0 lb

## 2022-03-23 DIAGNOSIS — Z7901 Long term (current) use of anticoagulants: Secondary | ICD-10-CM | POA: Insufficient documentation

## 2022-03-23 DIAGNOSIS — I480 Paroxysmal atrial fibrillation: Secondary | ICD-10-CM | POA: Insufficient documentation

## 2022-03-23 DIAGNOSIS — I4819 Other persistent atrial fibrillation: Secondary | ICD-10-CM | POA: Diagnosis not present

## 2022-03-23 DIAGNOSIS — D6869 Other thrombophilia: Secondary | ICD-10-CM

## 2022-03-23 DIAGNOSIS — Z79899 Other long term (current) drug therapy: Secondary | ICD-10-CM | POA: Insufficient documentation

## 2022-03-23 DIAGNOSIS — I493 Ventricular premature depolarization: Secondary | ICD-10-CM | POA: Insufficient documentation

## 2022-03-23 LAB — COMPLETE METABOLIC PANEL WITH GFR
AG Ratio: 1.5 (calc) (ref 1.0–2.5)
ALT: 36 U/L (ref 9–46)
AST: 26 U/L (ref 10–35)
Albumin: 4 g/dL (ref 3.6–5.1)
Alkaline phosphatase (APISO): 73 U/L (ref 35–144)
BUN: 16 mg/dL (ref 7–25)
CO2: 26 mmol/L (ref 20–32)
Calcium: 9.2 mg/dL (ref 8.6–10.3)
Chloride: 106 mmol/L (ref 98–110)
Creat: 0.99 mg/dL (ref 0.70–1.35)
Globulin: 2.6 g/dL (calc) (ref 1.9–3.7)
Glucose, Bld: 83 mg/dL (ref 65–99)
Potassium: 3.8 mmol/L (ref 3.5–5.3)
Sodium: 140 mmol/L (ref 135–146)
Total Bilirubin: 0.6 mg/dL (ref 0.2–1.2)
Total Protein: 6.6 g/dL (ref 6.1–8.1)
eGFR: 83 mL/min/{1.73_m2} (ref >=60)

## 2022-03-23 LAB — TSH: TSH: 3.48 mIU/L (ref 0.40–4.50)

## 2022-03-23 LAB — MAGNESIUM: Magnesium: 1.9 mg/dL (ref 1.5–2.5)

## 2022-03-23 NOTE — Progress Notes (Signed)
Primary Care Physician: Unk Pinto, MD Referring Physician:Dr. [name] is a 66 y.o. male with a h/o of afib ablation performed 8/1 by Dr. Curt Bears. He is in the afib clinic for f/u. He reports that he has not noted any afib on his strips per apple watch. He has seen a few PVC's. No swallowing or groin issues. He is back to his regular routine. He noted a calcium score of 412 and reached out to PCP that did an expanded lipid profile and overall looked favorable. He continues on anticoagulation.   Today, he denies symptoms of palpitations, chest pain, shortness of breath, orthopnea, PND, lower extremity edema, dizziness, presyncope, syncope, or neurologic sequela. The patient is tolerating medications without difficulties and is otherwise without complaint today.   Past Medical History:  Diagnosis Date   Family history of premature CAD    Hyperlipidemia    Hypertension    PAF (paroxysmal atrial fibrillation) ([place])    Prediabetes    Past Surgical History:  Procedure Laterality Date   ATRIAL FIBRILLATION ABLATION N/A 02/23/2022   Procedure: ATRIAL FIBRILLATION ABLATION;  Surgeon: Constance Haw, MD;  Location: [facility];  Service: Cardiovascular;  Laterality: N/A;   CARDIOVERSION N/A 10/05/2021   Procedure: CARDIOVERSION;  Surgeon: Donato Heinz, MD;  Location: [facility] END[facility];  Service: Cardiovascular;  Laterality: N/A;   CATARACT EXTRACTION W/ INTRAOCULAR LENS IMPLANT Left 03/26/2013   Dr. Herbert Deaner    Current Outpatient Medications  Medication Sig Dispense Refill   apixaban (ELIQUIS) 5 MG TABS tablet Take  1 tablet  2 x /day (every 12 hours) to prevent Blood Clots                                       /                         TAKE      BYMOUTH 180 tablet 3   bisoprolol (ZEBETA) 5 MG tablet Take 1 tablet Daily for BP & Heart Rate & Rhythm                               /                                      TAKE                         BY                   MOUTH (Patient taking differently: Take 5 mg by mouth daily.) 90 tablet 3   diltiazem (TIAZAC) 240 MG 24 hr capsule Take  1 capsule  Daily  for BP  & Heart Rhythm 90 capsule 3   nitroGLYCERIN (NITROSTAT) 0.4 MG SL tablet Place 0.4 mg under the tongue every 5 (five) minutes as needed for chest pain.     tadalafil (CIALIS) 20 MG tablet Take  1/2 to 1 tablet  every 2 to 3 days  as needed for  XXXX 30 tablet 3   No current facility-administered medications for this encounter.    No Known Allergies  Social History   Socioeconomic History  Marital status: Married    Spouse name: Not on file   Number of children: Not on file   Years of education: Not on file   Highest education level: Not on file  Occupational History   Not on file  Tobacco Use   Smoking status: Never   Smokeless tobacco: Never  Substance and Sexual Activity   Alcohol use: No   Drug use: Not on file   Sexual activity: Not on file  Other Topics Concern   Not on file  Social History Narrative   Not on file   Social Determinants of Health   Financial Resource Strain: Not on file  Food Insecurity: Not on file  Transportation Needs: Not on file  Physical Activity: Not on file  Stress: Not on file  Social Connections: Not on file  Intimate Partner Violence: Not on file    Family History  Problem Relation Age of Onset   Cancer Father    Heart attack Brother     ROS- All systems are reviewed and negative except as per the HPI above  Physical Exam: Vitals:   03/23/22 1119  BP: 122/64  Pulse: (!) 53  Weight: 78.5 kg  Height: '5\' 11"'$  (1.803 m)   Wt Readings from Last 3 Encounters:  03/23/22 78.5 kg  02/23/22 81.6 kg  12/22/21 81.6 kg    Labs: Lab Results  Component Value Date   NA 140 03/22/2022   K 3.8 03/22/2022   CL 106 03/22/2022   CO2 26 03/22/2022   GLUCOSE 83 03/22/2022   BUN 16 03/22/2022   CREATININE 0.99 03/22/2022   CALCIUM 9.2 03/22/2022   MG 1.9 03/22/2022   Lab Results   Component Value Date   INR 1.1 09/11/2021   Lab Results  Component Value Date   CHOL 177 03/12/2022   HDL 66 03/12/2022   LDLCALC 93 03/12/2022   TRIG 90 03/12/2022     GEN- The patient is well appearing, alert and oriented x 3 today.   Head- normocephalic, atraumatic Eyes-  Sclera clear, conjunctiva pink Ears- hearing intact Oropharynx- clear Neck- supple, no JVP Lymph- no cervical lymphadenopathy Lungs- Clear to ausculation bilaterally, normal work of breathing Heart- Regular rate and rhythm, no murmurs, rubs or gallops, PMI not laterally displaced GI- soft, NT, ND, + BS Extremities- no clubbing, cyanosis, or edema MS- no significant deformity or atrophy Skin- no rash or lesion Psych- euthymic mood, full affect Neuro- strength and sensation are intact  EKG-Vent. rate 53 BPM PR interval 220 ms QRS duration 98 ms QT/QTcB 424/397 ms P-R-T axes 69 -76 -30 Sinus bradycardia with 1st degree A-V block Left axis deviation Anterior infarct , age undetermined Abnormal ECG When compared with ECG of 23-Feb-2022 12:25, PREVIOUS ECG IS PRESENT    Assessment and Plan:  1. Afib S/p ablation 02/23/22 He is in SR and has not noted any afib  Continue bisoprolol 5 mg daily and diltiazem 240 mg daily   2. CHA2DS2VASc  score of 2 Continue eliquis 5 mg bid  without interruption  3. PVC's  As noted per apple watch Watch for now     F/u with Dr. Curt Bears 05/26/22  Geroge Baseman. Julina Altmann, [facility] [address] [place], [place] [address] [phone]

## 2022-03-23 NOTE — Progress Notes (Signed)
<><><><><><><><><><><><><><><><><><><><><><><><><><><><><><><><><> <><><><><><><><><><><><><><><><><><><><><><><><><><><><><><><><><>  -   Thyroid Normal   ==================================================================  -  Magnesium = 1.9 - in lower normal range                                                     - goal is betw 2.0 - 2.5   - So..............Marland Kitchen  Recommend that you take                                                 Magnesium 500 mg  take 1 to 2 tablets  /day tablet daily   - also important to eat lots of  leafy green vegetables   - spinach - Kale - collards - greens - okra - asparagus  - broccoli - quinoa - squash - almonds   - black, red, white beans  -  peas - green beans ==================================================================   - Electrolytes - OK  - Potassium 3.8   - in lower normal range                                                     - goal is betw 3.5  -  2.5   - Recommend use "No Salt"  substitute liberally for Potassium  ==================================================================  - Kidneys- Liver - all  Normal / OK  <><><><><><><><><><><><><><><><><><><><><><><><><><><><><><><><><> <><><><><><><><><><><><><><><><><><><><><><><><><><><><><><><><><>

## 2022-03-30 ENCOUNTER — Other Ambulatory Visit: Payer: Self-pay | Admitting: Internal Medicine

## 2022-03-30 MED ORDER — ROSUVASTATIN CALCIUM 20 MG PO TABS: ORAL_TABLET | ORAL | 3 refills | Status: DC

## 2022-03-30 NOTE — Progress Notes (Signed)
   Rx for Crestor 20 mg Daily sent in per Dr Curt Bears 's Recommendation &                        Will recheck in 3 months - Patient informed

## 2022-04-01 ENCOUNTER — Other Ambulatory Visit: Payer: Self-pay | Admitting: Internal Medicine

## 2022-04-02 ENCOUNTER — Telehealth: Payer: Self-pay | Admitting: Cardiology

## 2022-04-02 NOTE — Telephone Encounter (Signed)
Pt is returning call in regards to results. Requesting call back.  

## 2022-04-05 NOTE — Telephone Encounter (Signed)
Informed pt that Dr. Curt Bears recommends starting a statin based on recent lipid test after some calcium noted on CT scan. Pt concerned that a statin could worsen his lipoprotein a, which is elevated. Aware I will further discuss w/ MD and let him know. Pt agreeable to plan.

## 2022-04-19 NOTE — Telephone Encounter (Signed)
Pt aware this was reviewed by MD & pharmD. Informed that recommendation is elevated LPA is a significant risk factor for CAD so starting a statin has more benefit than risk Pt would like to investigate recommendation further.  Aware I will send recommendation via mychart for him to research and let us know. Patient verbalized understanding and agreeable to plan.

## 2022-05-26 ENCOUNTER — Ambulatory Visit: Payer: PPO | Attending: Cardiology | Admitting: Cardiology

## 2022-05-26 ENCOUNTER — Encounter: Payer: Self-pay | Admitting: Cardiology

## 2022-05-26 VITALS — BP 132/88 | HR 63 | Ht 71.0 in | Wt 181.0 lb

## 2022-05-26 DIAGNOSIS — D6869 Other thrombophilia: Secondary | ICD-10-CM | POA: Diagnosis not present

## 2022-05-26 DIAGNOSIS — I4819 Other persistent atrial fibrillation: Secondary | ICD-10-CM

## 2022-05-26 NOTE — Progress Notes (Signed)
Electrophysiology Office Note   Date:  05/26/2022   ID:  Frank Hunt, DOB 1955-11-20, MRN [id number]  PCP:  Unk Pinto, MD  Cardiologist:  Burt Knack Primary Electrophysiologist:  Jaydence Arnesen Meredith Leeds, MD    Chief Complaint: AF   History of Present Illness: Frank Hunt is a 66 y.o. male who is being seen today for the evaluation of AF at the request of Unk Pinto, MD. Presenting today for electrophysiology evaluation.  Has a history significant atrial fibrillation, hypertension, hyperlipidemia.  After his diagnosis of atrial fibrillation, he has had 3 cardioversions but did not convert to sinus rhythm.  Atrial fibrillation was diagnosed January 2023.  He had symptoms of fatigue, dyspnea with exertion.  He also had intermittent lightheadedness.  He is now status post ablation April 23.  Today, denies symptoms of palpitations, chest pain, shortness of breath, orthopnea, PND, lower extremity edema, claudication, dizziness, presyncope, syncope, bleeding, or neurologic sequela. The patient is tolerating medications without difficulties.  Since being seen he has done well.  He has had no further episodes of atrial fibrillation since his ablation.  He has much more energy and less fatigue.   Past Medical History:  Diagnosis Date   Family history of premature CAD    Hyperlipidemia    Hypertension    PAF (paroxysmal atrial fibrillation) ([place])    Prediabetes    Past Surgical History:  Procedure Laterality Date   ATRIAL FIBRILLATION ABLATION N/A 02/23/2022   Procedure: ATRIAL FIBRILLATION ABLATION;  Surgeon: Constance Haw, MD;  Location: [facility];  Service: Cardiovascular;  Laterality: N/A;   CARDIOVERSION N/A 10/05/2021   Procedure: CARDIOVERSION;  Surgeon: Donato Heinz, MD;  Location: [facility] ENDOS[facility];  Service: Cardiovascular;  Laterality: N/A;   CATARACT EXTRACTION W/ INTRAOCULAR LENS IMPLANT Left 03/26/2013   Dr. Herbert Deaner     Current Outpatient  Medications  Medication Sig Dispense Refill   apixaban (ELIQUIS) 5 MG TABS tablet Take  1 tablet  2 x /day (every 12 hours) to prevent Blood Clots                                       /                         TAKE      BYMOUTH 180 tablet 3   bisoprolol (ZEBETA) 5 MG tablet Take 1 tablet Daily for BP & Heart Rate & Rhythm                               /                                      TAKE                         BY                  MOUTH (Patient taking differently: Take 5 mg by mouth daily.) 90 tablet 3   nitroGLYCERIN (NITROSTAT) 0.4 MG SL tablet Place 0.4 mg under the tongue every 5 (five) minutes as needed for chest pain.     rosuvastatin (CRESTOR) 20 MG tablet Take 1 tablet Daily for  Cholesterol 90 tablet 3   tadalafil (CIALIS) 20 MG tablet Take  1/2 to 1 tablet  every 2 to 3 days  as needed for  XXXX 30 tablet 3   No current facility-administered medications for this visit.    Allergies:   Patient has no known allergies.   Social History:  The patient  reports that he has never smoked. He has never used smokeless tobacco. He reports that he does not drink alcohol.   Family History:  The patient's family history includes Cancer in his father; Heart attack in his brother.    ROS:  Please see the history of present illness.   Otherwise, review of systems is positive for none.   All other systems are reviewed and negative.   PHYSICAL EXAM: VS:  BP 132/88   Pulse 63   Ht '5\' 11"'$  (1.803 m)   Wt 181 lb (82.1 kg)   SpO2 97%   BMI 25.24 kg/m  , BMI Body mass index is 25.24 kg/m. GEN: Well nourished, well developed, in no acute distress  HEENT: normal  Neck: no JVD, carotid bruits, or masses Cardiac: RRR; no murmurs, rubs, or gallops,no edema  Respiratory:  clear to auscultation bilaterally, normal work of breathing GI: soft, nontender, nondistended, + BS MS: no deformity or atrophy  Skin: warm and dry Neuro:  Strength and sensation are intact Psych: euthymic mood, full  affect  EKG:  EKG is not ordered today. Personal review of the ekg ordered 03/23/22 shows sinus rhythm, rate 53  Recent Labs: 02/08/2022: Hemoglobin 16.2; Platelets 164 03/22/2022: ALT 36; BUN 16; Creat 0.99; Magnesium 1.9; Potassium 3.8; Sodium 140; TSH 3.48    Lipid Panel     Component Value Date/Time   CHOL 177 03/12/2022 1047   TRIG 90 03/12/2022 1047   HDL 66 03/12/2022 1047   CHOLHDL 2.7 03/12/2022 1047   VLDL 24 10/15/2016 1301   LDLCALC 93 03/12/2022 1047     Wt Readings from Last 3 Encounters:  05/26/22 181 lb (82.1 kg)  03/23/22 173 lb (78.5 kg)  02/23/22 180 lb (81.6 kg)      Other studies Reviewed: Additional studies/ records that were reviewed today include: TTE 11/10/21  Review of the above records today demonstrates:   1. Left ventricular ejection fraction, by estimation, is 50 to 55%. The  left ventricle has low normal function. The left ventricle has no regional  wall motion abnormalities. Left ventricular diastolic function could not  be evaluated.   2. Right ventricular systolic function is normal. The right ventricular  size is normal. There is normal pulmonary artery systolic pressure.   3. Left atrial size was mildly dilated.   4. Right atrial size was mildly dilated.   5. The mitral valve is normal in structure. Trivial mitral valve  regurgitation. No evidence of mitral stenosis.   6. The aortic valve is tricuspid. Aortic valve regurgitation is trivial.  Aortic valve sclerosis/calcification is present, without any evidence of  aortic stenosis.   7. Aortic dilatation noted. There is borderline dilatation of the  ascending aorta, measuring 38 mm.   8. The inferior vena cava is normal in size with greater than 50%  respiratory variability, suggesting right atrial pressure of 3 mmHg.    ASSESSMENT AND PLAN:  1.  Persistent atrial fibrillation: CHA2DS2-VASc of 2.  Currently on diltiazem to 40 mg daily, Eliquis 5 mg twice daily, bisoprolol 7.5 mg  twice daily.  Is status post ablation 02/23/2022.  Had no  further episodes of atrial fibrillation.  Happy with his control.  We Lynanne Delgreco stop diltiazem today.  2.  Secondary hypercoagulable state: Currently on Eliquis for atrial fibrillation as above.   Current medicines are reviewed at length with the patient today.   The patient does not have concerns regarding his medicines.  The following changes were made today: Stop diltiazem  Labs/ tests ordered today include:  No orders of the defined types were placed in this encounter.    Disposition:   FU 6 months  Signed, Cyara Devoto Meredith Leeds, MD  05/26/2022 12:34 PM     [address] [place] [address] [phone] (office) [phone] (fax)

## 2022-05-26 NOTE — Patient Instructions (Signed)
Medication Instructions:  Your physician has recommended you make the following change in your medication:  STOP Diltiazem  *If you need a refill on your cardiac medications before your next appointment, please call your pharmacy*   Lab Work: None ordered   Testing/Procedures: None ordered   Follow-Up: At [facility], you and your health needs are our priority.  As part of our continuing mission to provide you with exceptional heart care, we have created designated Provider Care Teams.  These Care Teams include your primary Cardiologist (physician) and Advanced Practice Providers (APPs -  Physician Assistants and Nurse Practitioners) who all work together to provide you with the care you need, when you need it.  Your next appointment:   6 month(s)  The format for your next appointment:   In Person  Provider:   Allegra Lai, MD    Thank you for choosing [facility]!!   Trinidad Curet, RN [phone]  Other Instructions   Important Information About Sugar

## 2022-06-14 ENCOUNTER — Other Ambulatory Visit: Payer: Self-pay | Admitting: Internal Medicine

## 2022-06-14 DIAGNOSIS — I10 Essential (primary) hypertension: Secondary | ICD-10-CM

## 2022-06-14 DIAGNOSIS — I480 Paroxysmal atrial fibrillation: Secondary | ICD-10-CM

## 2022-06-14 MED ORDER — DILTIAZEM HCL ER BEADS 180 MG PO CP24: ORAL_CAPSULE | ORAL | 3 refills | Status: DC

## 2022-07-16 ENCOUNTER — Other Ambulatory Visit: Payer: Self-pay | Admitting: Internal Medicine

## 2022-07-16 DIAGNOSIS — N5201 Erectile dysfunction due to arterial insufficiency: Secondary | ICD-10-CM

## 2022-07-29 ENCOUNTER — Ambulatory Visit (INDEPENDENT_AMBULATORY_CARE_PROVIDER_SITE_OTHER): Payer: PPO | Admitting: Internal Medicine

## 2022-07-29 ENCOUNTER — Other Ambulatory Visit: Payer: Self-pay | Admitting: Internal Medicine

## 2022-07-29 VITALS — BP 120/76 | HR 66 | Temp 98.6°F | Resp 12 | Ht 71.0 in | Wt 181.0 lb

## 2022-07-29 DIAGNOSIS — C44722 Squamous cell carcinoma of skin of right lower limb, including hip: Secondary | ICD-10-CM

## 2022-07-29 DIAGNOSIS — I10 Essential (primary) hypertension: Secondary | ICD-10-CM

## 2022-07-29 DIAGNOSIS — D0471 Carcinoma in situ of skin of right lower limb, including hip: Secondary | ICD-10-CM | POA: Diagnosis not present

## 2022-07-29 DIAGNOSIS — D485 Neoplasm of uncertain behavior of skin: Secondary | ICD-10-CM

## 2022-07-29 NOTE — Progress Notes (Signed)
    Future Appointments  Date Time Provider [place]  10/26/2022  3:00 PM Alycia Rossetti, NP [facility] None    History of Present Illness:     Patient is a very nice 67 yo MWM  with  labile HTN ( July 2022) ,pAfib s/p Ablation, HLD who presents for evaluation. HT systems & cardiac systems review is negative. Patient also has concerns re: a non healing raised skin lesion of the anterior Rt lower thigh.   Medications    bisoprolol (ZEBETA) 5 MG tablet, Take 1 tablet Daily    diltiazem (TIAZAC) 180 MG 24 hr capsule, Take  1 capsule  Daily  for BP  & Heart Rhythm   rosuvastatin (CRESTOR) 20 MG tablet, Take 1 tablet Daily for Cholesterol   tadalafil (CIALIS) 20 MG tablet, Take  1/2 to 1 tablet  every 2 to 3 days  if needed     apixaban (ELIQUIS) 5 MG TABS tablet, Take  1 tablet  2 x /day (every 12 hours)   Problem list He has ADD (attention deficit disorder); Hyperlipidemia, mixed; Prediabetes; Vitamin D deficiency; Low back pain with right-sided sciatica; Essential hypertension; Body mass index (BMI) 28.0-28.9, adult; Chronic low back pain; Elevated blood-pressure reading, without diagnosis of hypertension; Pain in thumb joint with movement of left hand; Lumbar radiculopathy; Spondylolisthesis; PAF (paroxysmal atrial fibrillation) ([place]); and Persistent atrial fibrillation (HCC) on their problem list.   Observations/Objective:  Skin focused exam finds on the anterior lower Rt anterior thigh a 10 x 12 mm raised pink slightly ulcerated  skin lesion .  Procedure    ( CPT  -   [id number]   )      After informed consent and aseptic prep with alcohol, the lesion was locally anesthetized with 2 ml Marcaine 0.5% SQ & intradermal. Then with a #10 scalpel ,the lesion was excised full thickness to the underlying muscle fascia by shave technique  in an elliptical fashion. Then the wound edges were  aligned & everted & secured with # 6 sutures of Nylon 3-0.   Antibiotic ung  & sterile dressing was  applied & covered with a 8' x 8"  Tegaderm. Patient was instructed in wound care and advised to return in 10 days for suture removal    Assessment and Plan:   1. Essential hypertension   2. SCC (squamous cell carcinoma), leg, right  - Surgical pathology     Follow Up Instructions:      I discussed the assessment and treatment plan with the patient. The patient was provided an opportunity to ask questions and all were answered. The patient agreed with the plan and demonstrated an understanding of the instructions.       The patient was advised to call back or seek an in-person evaluation if the symptoms worsen or if the condition fails to improve as anticipated.   Kirtland Bouchard, MD

## 2022-08-04 NOTE — Progress Notes (Signed)
<><><><><><><><><><><><><><><><><><><><><><><><><><><><><><><><><> <><><><><><><><><><><><><><><><><><><><><><><><><><><><><><><><><>  -   Path shows  1 margin involved                                            - So will need a slightly wider re-excision of the current scar  - Schedule at your convenience  <><><><><><><><><><><><><><><><><><><><><><><><><><><><><><><><><> <><><><><><><><><><><><><><><><><><><><><><><><><><><><><><><><><>

## 2022-08-06 ENCOUNTER — Other Ambulatory Visit: Payer: Self-pay | Admitting: Internal Medicine

## 2022-08-06 ENCOUNTER — Encounter: Payer: Self-pay | Admitting: Internal Medicine

## 2022-08-06 MED ORDER — CEPHALEXIN 500 MG PO CAPS: ORAL_CAPSULE | ORAL | 0 refills | Status: DC

## 2022-09-02 ENCOUNTER — Encounter (HOSPITAL_COMMUNITY): Payer: Self-pay | Admitting: *Deleted

## 2022-10-26 ENCOUNTER — Ambulatory Visit: Payer: PPO | Admitting: Nurse Practitioner

## 2022-11-07 ENCOUNTER — Other Ambulatory Visit: Payer: Self-pay | Admitting: Internal Medicine

## 2022-11-07 DIAGNOSIS — I480 Paroxysmal atrial fibrillation: Secondary | ICD-10-CM

## 2022-11-08 ENCOUNTER — Other Ambulatory Visit: Payer: Self-pay | Admitting: Internal Medicine

## 2022-11-08 ENCOUNTER — Other Ambulatory Visit: Payer: Self-pay

## 2022-11-08 DIAGNOSIS — I480 Paroxysmal atrial fibrillation: Secondary | ICD-10-CM

## 2022-11-08 MED ORDER — APIXABAN 5 MG PO TABS: ORAL_TABLET | ORAL | 3 refills | Status: DC

## 2023-01-25 DIAGNOSIS — Z961 Presence of intraocular lens: Secondary | ICD-10-CM | POA: Diagnosis not present

## 2023-01-25 DIAGNOSIS — H5213 Myopia, bilateral: Secondary | ICD-10-CM | POA: Diagnosis not present

## 2023-01-25 DIAGNOSIS — H532 Diplopia: Secondary | ICD-10-CM | POA: Diagnosis not present

## 2023-02-04 ENCOUNTER — Ambulatory Visit: Payer: PPO | Admitting: Internal Medicine

## 2023-02-08 ENCOUNTER — Ambulatory Visit (INDEPENDENT_AMBULATORY_CARE_PROVIDER_SITE_OTHER): Payer: PPO | Admitting: Internal Medicine

## 2023-02-08 VITALS — BP 130/70 | HR 82 | Temp 97.9°F | Resp 17 | Ht 71.0 in | Wt 186.0 lb

## 2023-02-08 DIAGNOSIS — L820 Inflamed seborrheic keratosis: Secondary | ICD-10-CM | POA: Diagnosis not present

## 2023-02-08 DIAGNOSIS — R0989 Other specified symptoms and signs involving the circulatory and respiratory systems: Secondary | ICD-10-CM

## 2023-02-12 ENCOUNTER — Encounter: Payer: Self-pay | Admitting: Internal Medicine

## 2023-02-12 NOTE — Progress Notes (Signed)
.      History of Present Illness:      Patient is a very nice 67 yo MWM  with  labile HTN, pAfib s/p Ablation, HLD  who presents with concerns re: multiple raised skin lesions over trunk & extremities.      Current Outpatient Medications on File Prior to Visit  Medication Sig   apixaban (ELIQUIS) 5 MG  Take  1 tablet  2 x /day (every 12 hours   bisoprolol  5 MG tablet Take 1 tablet Daily   diltiazem  180 MG 24 hr capsule Take  1 capsule  Daily   rosuvastatin 20 MG tablet Take 1 tablet Daily    tadalafil 20 MG tablet Take  1/2 to 1 tablet  every 2 to 3 days  if needed             No current facility-administered medications on file prior to visit.    No Known Allergies   Problem list He has ADD (attention deficit disorder); Hyperlipidemia, mixed; Prediabetes; Vitamin D deficiency; Low back pain with right-sided sciatica; Essential hypertension; Body mass index (BMI) 28.0-28.9, adult; Chronic low back pain; Elevated blood-pressure reading, without diagnosis of hypertension; Pain in thumb joint with movement of left hand; Lumbar radiculopathy; Spondylolisthesis; PAF (paroxysmal atrial fibrillation) (H[facility]); and Persistent atrial fibrillation ([facility]CC) on their problem list.   Observations/Objective:  BP 130/70   Pulse 82   Temp 97.9 F (36.6 C)   Resp 17   Ht 5\' 11"  (1.803 m)   Wt 186 lb (84.4 kg)   SpO2 95%   BMI 25.94 kg/m   Skin focused exam finds multiple raised brown, crusted & excoriated  irregular superficial skin lesions ranging in size from 10  x 10 mm to 15- 20 mm over trunk & extremities.    Procedure  ( CPT  -  17004)        After informed consent and aseptic prep with alcohol  # 20 lesions - raised brown , crusted & excoriated ranging in size from 10  x 10 mm to 15- 20 mm were treated with liquid nitrogen by triple freeze Sylvie Farrier technique  or anesthetized with Marcaine 0.5% and excised by shave excisional technique & hyfrecated for hemostasis.                                                       Then the surgical sites were  applied antibiotic und & covered with Tegaderm . Patient was instructed in post-0o wound care til healed.    Assessment and Plan:   1. Labile hypertension   2. Inflamed seborrheic keratosis   Follow Up Instructions:       I discussed the assessment and treatment plan with the patient. The patient was provided an opportunity to ask questions and all were answered. The patient agreed with the plan and demonstrated an understanding of the instructions.       The patient was advised to call back or seek an in-person evaluation if the symptoms worsen or if the condition fails to improve as anticipated.    Marinus Maw, MD

## 2023-02-15 DIAGNOSIS — H532 Diplopia: Secondary | ICD-10-CM | POA: Diagnosis not present

## 2023-02-19 ENCOUNTER — Other Ambulatory Visit: Payer: Self-pay | Admitting: Internal Medicine

## 2023-02-19 DIAGNOSIS — R0989 Other specified symptoms and signs involving the circulatory and respiratory systems: Secondary | ICD-10-CM

## 2023-04-11 ENCOUNTER — Other Ambulatory Visit: Payer: Self-pay | Admitting: Internal Medicine

## 2023-04-11 DIAGNOSIS — Z0000 Encounter for general adult medical examination without abnormal findings: Secondary | ICD-10-CM

## 2023-04-11 DIAGNOSIS — R5383 Other fatigue: Secondary | ICD-10-CM

## 2023-04-11 DIAGNOSIS — Z79899 Other long term (current) drug therapy: Secondary | ICD-10-CM

## 2023-04-11 DIAGNOSIS — N138 Other obstructive and reflux uropathy: Secondary | ICD-10-CM

## 2023-04-11 DIAGNOSIS — I10 Essential (primary) hypertension: Secondary | ICD-10-CM

## 2023-04-11 DIAGNOSIS — R7989 Other specified abnormal findings of blood chemistry: Secondary | ICD-10-CM

## 2023-04-11 DIAGNOSIS — E782 Mixed hyperlipidemia: Secondary | ICD-10-CM

## 2023-04-11 DIAGNOSIS — I480 Paroxysmal atrial fibrillation: Secondary | ICD-10-CM

## 2023-04-11 DIAGNOSIS — R7309 Other abnormal glucose: Secondary | ICD-10-CM

## 2023-04-11 DIAGNOSIS — Z125 Encounter for screening for malignant neoplasm of prostate: Secondary | ICD-10-CM

## 2023-04-11 DIAGNOSIS — E559 Vitamin D deficiency, unspecified: Secondary | ICD-10-CM

## 2023-04-13 ENCOUNTER — Other Ambulatory Visit: Payer: Self-pay | Admitting: Internal Medicine

## 2023-04-13 DIAGNOSIS — I480 Paroxysmal atrial fibrillation: Secondary | ICD-10-CM

## 2023-04-13 DIAGNOSIS — I4819 Other persistent atrial fibrillation: Secondary | ICD-10-CM

## 2023-04-13 DIAGNOSIS — I10 Essential (primary) hypertension: Secondary | ICD-10-CM

## 2023-04-13 DIAGNOSIS — R7309 Other abnormal glucose: Secondary | ICD-10-CM

## 2023-04-13 DIAGNOSIS — E782 Mixed hyperlipidemia: Secondary | ICD-10-CM

## 2023-04-20 ENCOUNTER — Other Ambulatory Visit: Payer: PPO

## 2023-04-20 DIAGNOSIS — R5383 Other fatigue: Secondary | ICD-10-CM | POA: Diagnosis not present

## 2023-04-20 DIAGNOSIS — Z125 Encounter for screening for malignant neoplasm of prostate: Secondary | ICD-10-CM | POA: Diagnosis not present

## 2023-04-20 DIAGNOSIS — R7309 Other abnormal glucose: Secondary | ICD-10-CM | POA: Diagnosis not present

## 2023-04-20 DIAGNOSIS — I480 Paroxysmal atrial fibrillation: Secondary | ICD-10-CM | POA: Diagnosis not present

## 2023-04-20 DIAGNOSIS — N401 Enlarged prostate with lower urinary tract symptoms: Secondary | ICD-10-CM | POA: Diagnosis not present

## 2023-04-20 DIAGNOSIS — I10 Essential (primary) hypertension: Secondary | ICD-10-CM | POA: Diagnosis not present

## 2023-04-20 DIAGNOSIS — E782 Mixed hyperlipidemia: Secondary | ICD-10-CM | POA: Diagnosis not present

## 2023-04-20 DIAGNOSIS — E559 Vitamin D deficiency, unspecified: Secondary | ICD-10-CM | POA: Diagnosis not present

## 2023-04-20 DIAGNOSIS — N138 Other obstructive and reflux uropathy: Secondary | ICD-10-CM | POA: Diagnosis not present

## 2023-04-20 DIAGNOSIS — Z79899 Other long term (current) drug therapy: Secondary | ICD-10-CM | POA: Diagnosis not present

## 2023-04-20 DIAGNOSIS — R7989 Other specified abnormal findings of blood chemistry: Secondary | ICD-10-CM | POA: Diagnosis not present

## 2023-04-23 NOTE — Progress Notes (Signed)
<>*<>*<>*<>*<>*<>*<>*<>*<>*<>*<>*<>*<>*<>*<>*<>*<>*<>*<>*<>*<>*<>*<>*<>*<> <>*<>*<>*<>*<>*<>*<>*<>*<>*<>*<>*<>*<>*<>*<>*<>*<>*<>*<>*<>*<>*<>*<>*<>*<>  -  Test results slightly outside the reference range are not unusual. If there is anything important, I will review this with you,  otherwise it is considered normal test values.  If you have further questions,  please do not hesitate to contact me at the office or via My Chart.   <>*<>*<>*<>*<>*<>*<>*<>*<>*<>*<>*<>*<>*<>*<>*<>*<>*<>*<>*<>*<>*<>*<>*<>*<> <>*<>*<>*<>*<>*<>*<>*<>*<>*<>*<>*<>*<>*<>*<>*<>*<>*<>*<>*<>*<>*<>*<>*<>*<>  -   PSA - very low - No Prostate Cancer  - Great  !   <>*<>*<>*<>*<>*<>*<>*<>*<>*<>*<>*<>*<>*<>*<>*<>*<>*<>*<>*<>*<>*<>*<>*<>*<> <>*<>*<>*<>*<>*<>*<>*<>*<>*<>*<>*<>*<>*<>*<>*<>*<>*<>*<>*<>*<>*<>*<>*<>*<>  - Testosterone - Normal   <>*<>*<>*<>*<>*<>*<>*<>*<>*<>*<>*<>*<>*<>*<>*<>*<>*<>*<>*<>*<>*<>*<>*<>*<> <>*<>*<>*<>*<>*<>*<>*<>*<>*<>*<>*<>*<>*<>*<>*<>*<>*<>*<>*<>*<>*<>*<>*<>*<>  - A1c back to Normal nonDiabetic Range - Great !   <>*<>*<>*<>*<>*<>*<>*<>*<>*<>*<>*<>*<>*<>*<>*<>*<>*<>*<>*<>*<>*<>*<>*<>*<> <>*<>*<>*<>*<>*<>*<>*<>*<>*<>*<>*<>*<>*<>*<>*<>*<>*<>*<>*<>*<>*<>*<>*<>*<>  - Vitamin D = 81 - Excellent - Please keep dosage same   <>*<>*<>*<>*<>*<>*<>*<>*<>*<>*<>*<>*<>*<>*<>*<>*<>*<>*<>*<>*<>*<>*<>*<>*<> <>*<>*<>*<>*<>*<>*<>*<>*<>*<>*<>*<>*<>*<>*<>*<>*<>*<>*<>*<>*<>*<>*<>*<>*<>  - All Else - CBC - Kidneys - Electrolytes - Liver - Magnesium & Thyroid    - all  Normal / OK  <>*<>*<>*<>*<>*<>*<>*<>*<>*<>*<>*<>*<>*<>*<>*<>*<>*<>*<>*<>*<>*<>*<>*<>*<> <>*<>*<>*<>*<>*<>*<>*<>*<>*<>*<>*<>*<>*<>*<>*<>*<>*<>*<>*<>*<>*<>*<>*<>*<>

## 2023-05-02 ENCOUNTER — Encounter: Payer: Self-pay | Admitting: Cardiovascular Disease

## 2023-05-02 ENCOUNTER — Ambulatory Visit: Payer: PPO | Attending: Cardiovascular Disease | Admitting: Cardiovascular Disease

## 2023-05-02 ENCOUNTER — Other Ambulatory Visit: Payer: Self-pay

## 2023-05-02 VITALS — BP 130/70 | HR 73 | Ht 71.0 in | Wt 177.6 lb

## 2023-05-02 DIAGNOSIS — I10 Essential (primary) hypertension: Secondary | ICD-10-CM

## 2023-05-02 DIAGNOSIS — I4819 Other persistent atrial fibrillation: Secondary | ICD-10-CM | POA: Diagnosis not present

## 2023-05-02 DIAGNOSIS — E782 Mixed hyperlipidemia: Secondary | ICD-10-CM | POA: Diagnosis not present

## 2023-05-02 DIAGNOSIS — I480 Paroxysmal atrial fibrillation: Secondary | ICD-10-CM

## 2023-05-02 DIAGNOSIS — D6869 Other thrombophilia: Secondary | ICD-10-CM

## 2023-05-02 NOTE — Progress Notes (Signed)
Cardiology Office Note:    Date:  05/02/2023   ID:  Frank Hunt, DOB [birth year], MRN [id number]  PCP:  [name], MD   [facility] Providers Cardiologist:  Tonny Bollman, MD Electrophysiologist:  Will Jorja Loa, MD     Referring MD: [name], MD   Chief Complaint  Patient presents with   Atrial Fibrillation    History of Present Illness:    Frank Hunt is a 67 y.o. male with a hx of atrial fibrillation, presenting for follow-up evaluation. Comorbid medical conditions include hypertension, hyperlipidemia, prediabetes, and family history of premature CAD in his brother who had an MI at age 35. He failed cardioversion and was ultimately referred to EP, and underwent atrial fib ablation in 2023.   The patient is here alone today. He is doing well. He has some concerns about taking statin drugs so he has been taking 1/2 of a crestor each day. He currently has a CardioIQ test scheduled through his PCP.  The patient is physically active, regularly exercising and going to [employer].  He has no exertional chest pain or pressure, dyspnea, edema, orthopnea, or PND.  He has had some LP(a) testing that has been elevated at 136.  Past Medical History:  Diagnosis Date   Family history of premature CAD    Hyperlipidemia    Hypertension    PAF (paroxysmal atrial fibrillation) (HCC)    Prediabetes     Past Surgical History:  Procedure Laterality Date   ATRIAL FIBRILLATION ABLATION N/A 02/23/2022   Procedure: ATRIAL FIBRILLATION ABLATION;  Surgeon: Regan Lemming, MD;  Location: [facility] INVASIVE CV LAB;  Service: Cardiovascular;  Laterality: N/A;   CARDIOVERSION N/A 10/05/2021   Procedure: CARDIOVERSION;  Surgeon: Little Ishikawa, MD;  Location: [facility] ENDOSCOPY;  Service: Cardiovascular;  Laterality: N/A;   CATARACT EXTRACTION W/ INTRAOCULAR LENS IMPLANT Left 03/26/2013   Dr. Elmer Picker    Current Medications: Current Meds  Medication Sig   apixaban  (ELIQUIS) 5 MG TABS tablet Take  1 tablet  2 x /day (every 12 hours) to prevent Blood Clots                                       /                         TAKE      BYMOUTH   bisoprolol (ZEBETA) 5 MG tablet TAKE 1 TABLET BY MOUTH DAILY FOR BLOOD PRESSURE AND HEART RATE/RHYTHM   diltiazem (TIAZAC) 180 MG 24 hr capsule Take  1 capsule  Daily  for BP  & Heart Rhythm   rosuvastatin (CRESTOR) 20 MG tablet Take 1 tablet Daily for Cholesterol (Patient taking differently: Take 20 mg by mouth daily. Per patient taking 1/2 tablet daily)   tadalafil (CIALIS) 20 MG tablet Take  1/2 to 1 tablet  every 2 to 3 days  if needed for XXXX                            \  /  TAKE                                   BY                                   MOUTH   [DISCONTINUED] cephALEXin (KEFLEX) 500 MG capsule Take  1 capsule  3 x /day  with meals for Skin Infection     Allergies:   Patient has no known allergies.   Social History   Socioeconomic History   Marital status: Married    Spouse name: Not on file   Number of children: Not on file   Years of education: Not on file   Highest education level: Not on file  Occupational History   Not on file  Tobacco Use   Smoking status: Never   Smokeless tobacco: Never  Substance and Sexual Activity   Alcohol use: No   Drug use: Not on file   Sexual activity: Not on file  Other Topics Concern   Not on file  Social History Narrative   Not on file   Social Determinants of Health   Financial Resource Strain: Not on file  Food Insecurity: Not on file  Transportation Needs: Not on file  Physical Activity: Not on file  Stress: Not on file  Social Connections: Not on file     Family History: The patient's family history includes Cancer in his father; Heart attack in his brother.  ROS:   Please see the history of present illness.     All other systems reviewed and are negative.  EKGs/Labs/Other Studies Reviewed:     The following studies were reviewed today: Echo 10/2021:  1. Left ventricular ejection fraction, by estimation, is 50 to 55%. The  left ventricle has low normal function. The left ventricle has no regional  wall motion abnormalities. Left ventricular diastolic function could not  be evaluated.   2. Right ventricular systolic function is normal. The right ventricular  size is normal. There is normal pulmonary artery systolic pressure.   3. Left atrial size was mildly dilated.   4. Right atrial size was mildly dilated.   5. The mitral valve is normal in structure. Trivial mitral valve  regurgitation. No evidence of mitral stenosis.   6. The aortic valve is tricuspid. Aortic valve regurgitation is trivial.  Aortic valve sclerosis/calcification is present, without any evidence of  aortic stenosis.   7. Aortic dilatation noted. There is borderline dilatation of the  ascending aorta, measuring 38 mm.   8. The inferior vena cava is normal in size with greater than 50%  respiratory variability, suggesting right atrial pressure of 3 mmHg.   Comparison(s): No prior Echocardiogram.  EKG Interpretation Date/Time:  Monday May 02 2023 13:55:21 EDT Ventricular Rate:  73 PR Interval:  204 QRS Duration:  108 QT Interval:  372 QTC Calculation: 409 R Axis:   -61  Text Interpretation: Normal sinus rhythm Left anterior fascicular block When compared with ECG of 23-Mar-2022 11:29, Criteria for Anterior infarct are no longer Present Otherwise no significant change Confirmed by Tonny Bollman [phone]) on 05/02/2023 2:04:11 PM    Recent Labs: 04/20/2023: ALT 27; BUN 19; Creat 1.32; Hemoglobin 15.9; Magnesium 1.9; Platelets 169; Potassium 4.2; Sodium 140; TSH 4.50  Recent Lipid Panel    Component Value Date/Time   CHOL 177  03/12/2022 1047   TRIG 90 03/12/2022 1047   HDL 66 03/12/2022 1047   CHOLHDL 2.7 03/12/2022 1047   VLDL 24 10/15/2016 1301   LDLCALC 93 03/12/2022 1047     Risk  Assessment/Calculations:    CHA2DS2-VASc Score = 2   This indicates a 2.2% annual risk of stroke. The patient's score is based upon: CHF History: 0 HTN History: 1 Diabetes History: 0 Stroke History: 0 Vascular Disease History: 0 Age Score: 1 Gender Score: 0               Physical Exam:    VS:  BP 130/70   Pulse 73   Ht 5\' 11"  (1.803 m)   Wt 177 lb 9.6 oz (80.6 kg)   SpO2 95%   BMI 24.77 kg/m     Wt Readings from Last 3 Encounters:  05/02/23 177 lb 9.6 oz (80.6 kg)  02/12/23 186 lb (84.4 kg)  08/06/22 181 lb (82.1 kg)     GEN:  Well nourished, well developed in no acute distress HEENT: Normal NECK: No JVD; No carotid bruits LYMPHATICS: No lymphadenopathy CARDIAC: RRR, no murmurs, rubs, gallops RESPIRATORY:  Clear to auscultation without rales, wheezing or rhonchi  ABDOMEN: Soft, non-tender, non-distended MUSCULOSKELETAL:  No edema; No deformity  SKIN: Warm and dry NEUROLOGIC:  Alert and oriented x 3 PSYCHIATRIC:  Normal affect   ASSESSMENT:    1. Persistent atrial fibrillation (HCC)   2. Secondary hypercoagulable state (HCC)   3. Essential hypertension   4. Hyperlipidemia, mixed    PLAN:    In order of problems listed above:  Doing very well on apixaban for anticoagulation, and a combination of bisoprolol and diltiazem.  He is status post ablation and having no recurrent symptoms. Tolerating apixaban without bleeding problems. Blood pressure well-controlled on bisoprolol and diltiazem. Treated with rosuvastatin 10 mg daily.  Patient with elevated coronary calcium score.  Will try to push his LDL below 70 mg/dL if possible.  Will follow-up on his cardio IQ testing which should be available in the next few days.  He understands there may be specific therapies directed at LP(a) becoming available in the near future.     Medication Adjustments/Labs and Tests Ordered: Current medicines are reviewed at length with the patient today.  Concerns regarding  medicines are outlined above.  Orders Placed This Encounter  Procedures   EKG 12-Lead   No orders of the defined types were placed in this encounter.   Patient Instructions  Medication Instructions:  Your physician recommends that you continue on your current medications as directed. Please refer to the Current Medication list given to you today.  *If you need a refill on your cardiac medications before your next appointment, please call your pharmacy*   Lab Work: NONE If you have labs (blood work) drawn today and your tests are completely normal, you will receive your results only by: MyChart Message (if you have MyChart) OR A paper copy in the mail If you have any lab test that is abnormal or we need to change your treatment, we will call you to review the results.   Testing/Procedures: NONE   Follow-Up: At [facility], you and your health needs are our priority.  As part of our continuing mission to provide you with exceptional heart care, we have created designated Provider Care Teams.  These Care Teams include your primary Cardiologist (physician) and Advanced Practice Providers (APPs -  Physician Assistants and Nurse Practitioners) who all work together  to provide you with the care you need, when you need it.  We recommend signing up for the patient portal called "MyChart".  Sign up information is provided on this After Visit Summary.  MyChart is used to connect with patients for Virtual Visits (Telemedicine).  Patients are able to view lab/test results, encounter notes, upcoming appointments, etc.  Non-urgent messages can be sent to your provider as well.   To learn more about what you can do with MyChart, go to [email or web address].    Your next appointment:   1 year(s)  Provider:   Tonny Bollman, MD     Signed, Tonny Bollman, MD  05/02/2023 4:43 PM    [facility]

## 2023-05-02 NOTE — Patient Instructions (Signed)
Medication Instructions:  Your physician recommends that you continue on your current medications as directed. Please refer to the Current Medication list given to you today.  *If you need a refill on your cardiac medications before your next appointment, please call your pharmacy*   Lab Work: NONE If you have labs (blood work) drawn today and your tests are completely normal, you will receive your results only by: MyChart Message (if you have MyChart) OR A paper copy in the mail If you have any lab test that is abnormal or we need to change your treatment, we will call you to review the results.   Testing/Procedures: NONE   Follow-Up: At [facility], you and your health needs are our priority.  As part of our continuing mission to provide you with exceptional heart care, we have created designated Provider Care Teams.  These Care Teams include your primary Cardiologist (physician) and Advanced Practice Providers (APPs -  Physician Assistants and Nurse Practitioners) who all work together to provide you with the care you need, when you need it.  We recommend signing up for the patient portal called "MyChart".  Sign up information is provided on this After Visit Summary.  MyChart is used to connect with patients for Virtual Visits (Telemedicine).  Patients are able to view lab/test results, encounter notes, upcoming appointments, etc.  Non-urgent messages can be sent to your provider as well.   To learn more about what you can do with MyChart, go to [email or web address].    Your next appointment:   1 year(s)  Provider:   Michael Cooper, MD      

## 2023-05-07 LAB — URINALYSIS, ROUTINE W REFLEX MICROSCOPIC
Bilirubin Urine: NEGATIVE
Glucose, UA: NEGATIVE
Hgb urine dipstick: NEGATIVE
Ketones, ur: NEGATIVE
Leukocytes,Ua: NEGATIVE
Nitrite: NEGATIVE
Protein, ur: NEGATIVE
Specific Gravity, Urine: 1.019 (ref 1.001–1.035)
pH: 6.5 (ref 5.0–8.0)

## 2023-05-07 LAB — CBC WITH DIFFERENTIAL/PLATELET
Absolute Monocytes: 455 {cells}/uL (ref 200–950)
Basophils Absolute: 30 {cells}/uL (ref 0–200)
Basophils Relative: 0.6 %
Eosinophils Absolute: 80 {cells}/uL (ref 15–500)
Eosinophils Relative: 1.6 %
HCT: 49 % (ref 38.5–50.0)
Hemoglobin: 15.9 g/dL (ref 13.2–17.1)
Lymphs Abs: 1270 {cells}/uL (ref 850–3900)
MCH: 30.6 pg (ref 27.0–33.0)
MCHC: 32.4 g/dL (ref 32.0–36.0)
MCV: 94.2 fL (ref 80.0–100.0)
MPV: 9.7 fL (ref 7.5–12.5)
Monocytes Relative: 9.1 %
Neutro Abs: 3165 {cells}/uL (ref 1500–7800)
Neutrophils Relative %: 63.3 %
Platelets: 169 10*3/uL (ref 140–400)
RBC: 5.2 10*6/uL (ref 4.20–5.80)
RDW: 12.8 % (ref 11.0–15.0)
Total Lymphocyte: 25.4 %
WBC: 5 10*3/uL (ref 3.8–10.8)

## 2023-05-07 LAB — CARDIO IQ(R) ADVANCED LIPID PANEL
Apolipoprotein B: 57 mg/dL (ref <90)
Cholesterol: 153 mg/dL (ref <200)
HDL LARGE: 11055 nmol/L (ref >6729)
HDL: 75 mg/dL (ref >39)
LDL Cholesterol (Calc): 59 mg/dL (ref <100)
LDL Medium: 215 nmol/L — ABNORMAL HIGH (ref <215)
LDL Particle Number: 1223 nmol/L — ABNORMAL HIGH (ref <1138)
LDL Peak Size: 224.8 Angstrom (ref >222.9)
LDL Small: 155 nmol/L — ABNORMAL HIGH (ref <142)
Lipoprotein (a): 103 nmol/L — ABNORMAL HIGH (ref <75)
Non-HDL Cholesterol (Calc): 78 mg/dL (ref <130)
Total CHOL/HDL Ratio: 2 calc (ref <5.0)
Triglycerides: 104 mg/dL (ref <150)

## 2023-05-07 LAB — COMPLETE METABOLIC PANEL WITH GFR
AG Ratio: 1.5 (calc) (ref 1.0–2.5)
ALT: 27 U/L (ref 9–46)
AST: 26 U/L (ref 10–35)
Albumin: 4 g/dL (ref 3.6–5.1)
Alkaline phosphatase (APISO): 57 U/L (ref 35–144)
BUN: 19 mg/dL (ref 7–25)
CO2: 26 mmol/L (ref 20–32)
Calcium: 9.2 mg/dL (ref 8.6–10.3)
Chloride: 106 mmol/L (ref 98–110)
Creat: 1.32 mg/dL (ref 0.70–1.35)
Globulin: 2.6 g/dL (ref 1.9–3.7)
Glucose, Bld: 98 mg/dL (ref 65–99)
Potassium: 4.2 mmol/L (ref 3.5–5.3)
Sodium: 140 mmol/L (ref 135–146)
Total Bilirubin: 0.6 mg/dL (ref 0.2–1.2)
Total Protein: 6.6 g/dL (ref 6.1–8.1)
eGFR: 58 mL/min/{1.73_m2} — ABNORMAL LOW (ref >=60)

## 2023-05-07 LAB — HEMOGLOBIN A1C
Hgb A1c MFr Bld: 5.6 %{Hb} (ref <5.7)
Mean Plasma Glucose: 114 mg/dL
eAG (mmol/L): 6.3 mmol/L

## 2023-05-07 LAB — MICROALBUMIN / CREATININE URINE RATIO
Creatinine, Urine: 148 mg/dL (ref 20–320)
Microalb Creat Ratio: 1 mg/g{creat} (ref <30)
Microalb, Ur: 0.2 mg/dL

## 2023-05-07 LAB — TSH: TSH: 4.5 m[IU]/L (ref 0.40–4.50)

## 2023-05-07 LAB — INSULIN, RANDOM: Insulin: 13.6 u[IU]/mL

## 2023-05-07 LAB — PSA: PSA: 0.61 ng/mL (ref <=4.00)

## 2023-05-07 LAB — TESTOSTERONE: Testosterone: 559 ng/dL (ref 250–827)

## 2023-05-07 LAB — MAGNESIUM: Magnesium: 1.9 mg/dL (ref 1.5–2.5)

## 2023-05-07 LAB — VITAMIN D 25 HYDROXY (VIT D DEFICIENCY, FRACTURES): Vit D, 25-Hydroxy: 81 ng/mL (ref 30–100)

## 2023-05-07 NOTE — Progress Notes (Signed)
<>*<>*<>*<>*<>*<>*<>*<>*<>*<>*<>*<>*<>*<>*<>*<>*<>*<>*<>*<>*<>*<>*<>*<>*<> <>*<>*<>*<>*<>*<>*<>*<>*<>*<>*<>*<>*<>*<>*<>*<>*<>*<>*<>*<>*<>*<>*<>*<>*<>  -  Test results slightly outside the reference range are not unusual. If there is anything important, I will review this with you,  otherwise it is considered normal test values.  If you have further questions,  please do not hesitate to contact me at the office or via My Chart.   <>*<>*<>*<>*<>*<>*<>*<>*<>*<>*<>*<>*<>*<>*<>*<>*<>*<>*<>*<>*<>*<>*<>*<>*<> <>*<>*<>*<>*<>*<>*<>*<>*<>*<>*<>*<>*<>*<>*<>*<>*<>*<>*<>*<>*<>*<>*<>*<>*<>  - Cardio IQ panel Improved !  Total Chol  177  ----- >>   153  HDL Chol    66   ------ >>     75   LDL Chol     93   ------ >>     59   Apo B           73  ------- >>     57  LP a             136 ------- >>   103  All Great  ! ! !  - Much Improved   Keep up The Great Work  ! ! !

## 2023-05-08 ENCOUNTER — Other Ambulatory Visit: Payer: Self-pay | Admitting: Internal Medicine

## 2023-05-08 DIAGNOSIS — N5201 Erectile dysfunction due to arterial insufficiency: Secondary | ICD-10-CM

## 2023-05-08 MED ORDER — TADALAFIL 20 MG PO TABS: ORAL_TABLET | ORAL | 3 refills | Status: DC

## 2023-05-11 ENCOUNTER — Encounter: Payer: Self-pay | Admitting: Cardiovascular Disease

## 2023-06-02 ENCOUNTER — Other Ambulatory Visit: Payer: Self-pay | Admitting: Internal Medicine

## 2023-06-02 DIAGNOSIS — I10 Essential (primary) hypertension: Secondary | ICD-10-CM

## 2023-06-02 DIAGNOSIS — I480 Paroxysmal atrial fibrillation: Secondary | ICD-10-CM

## 2023-08-29 ENCOUNTER — Other Ambulatory Visit: Payer: Self-pay

## 2023-08-29 DIAGNOSIS — I10 Essential (primary) hypertension: Secondary | ICD-10-CM

## 2023-08-29 DIAGNOSIS — I480 Paroxysmal atrial fibrillation: Secondary | ICD-10-CM

## 2023-08-29 DIAGNOSIS — N5201 Erectile dysfunction due to arterial insufficiency: Secondary | ICD-10-CM

## 2023-08-29 DIAGNOSIS — R0989 Other specified symptoms and signs involving the circulatory and respiratory systems: Secondary | ICD-10-CM

## 2023-08-29 MED ORDER — ROSUVASTATIN CALCIUM 20 MG PO TABS
ORAL_TABLET | ORAL | 3 refills | Status: AC
Start: 2023-08-29 — End: ?

## 2023-08-29 MED ORDER — TADALAFIL 20 MG PO TABS: ORAL_TABLET | ORAL | 3 refills | Status: DC

## 2023-08-29 MED ORDER — BISOPROLOL FUMARATE 5 MG PO TABS: ORAL_TABLET | ORAL | 3 refills | Status: AC

## 2023-08-29 MED ORDER — APIXABAN 5 MG PO TABS: ORAL_TABLET | ORAL | 3 refills | Status: DC

## 2023-08-29 MED ORDER — DILTIAZEM HCL ER BEADS 180 MG PO CP24: ORAL_CAPSULE | ORAL | 3 refills | Status: AC

## 2023-09-21 ENCOUNTER — Ambulatory Visit (HOSPITAL_COMMUNITY)
Admission: RE | Admit: 2023-09-21 | Discharge: 2023-09-21 | Disposition: A | Discharge status: Home / Self Care | Payer: PPO | Source: Ambulatory Visit | Attending: Physician Assistant | Admitting: Physician Assistant

## 2023-09-21 ENCOUNTER — Encounter: Payer: Self-pay | Admitting: Cardiovascular Disease

## 2023-09-21 ENCOUNTER — Encounter (HOSPITAL_COMMUNITY): Payer: Self-pay | Admitting: Physician Assistant

## 2023-09-21 ENCOUNTER — Telehealth: Payer: Self-pay | Admitting: Cardiovascular Disease

## 2023-09-21 VITALS — BP 110/92 | HR 148 | Ht 71.0 in | Wt 191.4 lb

## 2023-09-21 DIAGNOSIS — E785 Hyperlipidemia, unspecified: Secondary | ICD-10-CM | POA: Diagnosis not present

## 2023-09-21 DIAGNOSIS — I483 Typical atrial flutter: Secondary | ICD-10-CM | POA: Diagnosis not present

## 2023-09-21 DIAGNOSIS — I4891 Unspecified atrial fibrillation: Secondary | ICD-10-CM | POA: Diagnosis present

## 2023-09-21 DIAGNOSIS — I4819 Other persistent atrial fibrillation: Secondary | ICD-10-CM | POA: Diagnosis not present

## 2023-09-21 DIAGNOSIS — Z7901 Long term (current) use of anticoagulants: Secondary | ICD-10-CM | POA: Diagnosis not present

## 2023-09-21 DIAGNOSIS — I2510 Atherosclerotic heart disease of native coronary artery without angina pectoris: Secondary | ICD-10-CM | POA: Insufficient documentation

## 2023-09-21 DIAGNOSIS — D6869 Other thrombophilia: Secondary | ICD-10-CM | POA: Diagnosis not present

## 2023-09-21 DIAGNOSIS — I10 Essential (primary) hypertension: Secondary | ICD-10-CM | POA: Insufficient documentation

## 2023-09-21 DIAGNOSIS — I4892 Unspecified atrial flutter: Secondary | ICD-10-CM | POA: Diagnosis present

## 2023-09-21 LAB — BASIC METABOLIC PANEL
Anion gap: 11 (ref 5–15)
BUN: 22 mg/dL (ref 8–23)
CO2: 22 mmol/L (ref 22–32)
Calcium: 8.6 mg/dL — ABNORMAL LOW (ref 8.9–10.3)
Chloride: 105 mmol/L (ref 98–111)
Creatinine, Ser: 1.37 mg/dL — ABNORMAL HIGH (ref 0.61–1.24)
GFR, Estimated: 55 mL/min — ABNORMAL LOW (ref >60)
Glucose, Bld: 112 mg/dL — ABNORMAL HIGH (ref 70–99)
Potassium: 4.1 mmol/L (ref 3.5–5.1)
Sodium: 138 mmol/L (ref 135–145)

## 2023-09-21 LAB — CBC
HCT: 53.1 % — ABNORMAL HIGH (ref 39.0–52.0)
Hemoglobin: 17.1 g/dL — ABNORMAL HIGH (ref 13.0–17.0)
MCH: 30 pg (ref 26.0–34.0)
MCHC: 32.2 g/dL (ref 30.0–36.0)
MCV: 93.2 fL (ref 80.0–100.0)
Platelets: 161 10*3/uL (ref 150–400)
RBC: 5.7 MIL/uL (ref 4.22–5.81)
RDW: 12.7 % (ref 11.5–15.5)
WBC: 7.5 10*3/uL (ref 4.0–10.5)
nRBC: 0 % (ref 0.0–0.2)

## 2023-09-21 MED ORDER — DILTIAZEM HCL 30 MG PO TABS: ORAL_TABLET | ORAL | 1 refills | Status: AC

## 2023-09-21 NOTE — Telephone Encounter (Signed)
 STAT if HR is under 50 or over 120 (normal HR is 60-100 beats per minute)  What is your heart rate?   75-78 (from Apple Watch)  Do you have a log of your heart rate readings (document readings)?   Apple watch readings  Do you have any other symptoms?   Very tired  Patient stated on Monday and Tuesday he has been having increased HR readings around lunch time each day.  Patient stated his HR stayed high until he took an extra dose of Diltiazem and it took about an hour before his HR came down.

## 2023-09-21 NOTE — Patient Instructions (Addendum)
 Cardizem 30mg  -- Take 1 tablet every 4 hours AS NEEDED for AFIB heart rate >100 as long as top BP >100.      Cardioversion scheduled for: Thursday, February 27th   - Arrive at the [facility] and go to admitting at 11:15am   - Do not eat or drink anything after midnight the night prior to your procedure.   - Take all your morning medication (except diabetic medications) with a sip of water prior to arrival.  - You will not be able to drive home after your procedure.    - Do NOT miss any doses of your blood thinner - if you should miss a dose please notify our office immediately.   - If you feel as if you go back into normal rhythm prior to scheduled cardioversion, please notify our office immediately.   If your procedure is canceled in the cardioversion suite you will be charged a cancellation fee.  Follow up with Dr. Elberta Fortis - his office will call for appointment

## 2023-09-21 NOTE — Telephone Encounter (Signed)
 Spoke with patient and he states Monday and Tuesday his HR was really high 140's. Started around 12 on both days. He stated he took his Diltiazem earlier and it did start coming down. He was very tired and was a little SOB. Denied any chest pain. Today his HR is 70-75 form apple watch. He is taking medications has prescribed. Currently asymptomatic.   Advised to monitor BP/HR. Continue taking medications as prescribed. Will forward to provider.  ED precautions discussed

## 2023-09-21 NOTE — H&P (View-Only) (Signed)
 Primary Care Physician: [name], MD Primary Cardiologist: Tonny Bollman, MD Electrophysiologist: Will Jorja Loa, MD  Referring Physician: Dr Ok Anis is a 68 y.o. male with a history of HLD, HTN, CAD, atrial flutter, atrial fibrillation who presents for follow up in the [facility].  The patient was initially diagnosed with atrial fibrillation 07/2021. He had DCCV 09/2021 which was unsuccessful. He was seen by Dr Elberta Fortis and underwent afib ablation on 02/23/22. Patient is on Eliquis for stroke prevention.   Patient presents today for follow up for atrial flutter. He reports that he noticed an elevated heart rate after exercising over the weekend, rates ~130 bpm. His heart rates became more elevated the next two days up to 140s bpm. Patient went to his PCP office and ECG there showed typical atrial flutter HR 103 bpm. There were no specific triggers that he could identify.   Today, he denies symptoms of chest pain, shortness of breath, orthopnea, PND, lower extremity edema, dizziness, presyncope, syncope, snoring, daytime somnolence, bleeding, or neurologic sequela. The patient is tolerating medications without difficulties and is otherwise without complaint today.    Atrial Fibrillation Risk Factors:  he does not have symptoms or diagnosis of sleep apnea. Negative sleep study 2023. he does not have a history of rheumatic fever.   Atrial Fibrillation Management history:  Previous antiarrhythmic drugs: none Previous cardioversions: 09/2021 Previous ablations: 02/23/22 Anticoagulation history: Eliquis  ROS- All systems are reviewed and negative except as per the HPI above.  Past Medical History:  Diagnosis Date   Family history of premature CAD    Hyperlipidemia    Hypertension    PAF (paroxysmal atrial fibrillation) (HCC)    Prediabetes     Current Outpatient Medications  Medication Sig Dispense Refill   apixaban (ELIQUIS)  5 MG TABS tablet Take  1 tablet  2 x /day (every 12 hours) to prevent Blood Clots                                       /                         TAKE      BYMOUTH 180 tablet 3   bisoprolol (ZEBETA) 5 MG tablet TAKE 1 TABLET BY MOUTH DAILY FOR BLOOD PRESSURE AND HEART RATE/RHYTHM 90 tablet 3   diltiazem (TIAZAC) 180 MG 24 hr capsule TAKE 1 CAPSULE BY MOUTH DAILY FOR BLOOD PRESSURE AND HEART RHYTHM 90 capsule 3   rosuvastatin (CRESTOR) 20 MG tablet Take 1 tablet Daily for Cholesterol (Patient taking differently: Take 10 mg by mouth daily. Take 1 tablet Daily for Cholesterol) 90 tablet 3   tadalafil (CIALIS) 20 MG tablet Take  1/2 to 1 tablet  every 2 to 3 days  if needed for XXXX                    /                                                   TAKE  BY                                   MOUTH 30 tablet 3   No current facility-administered medications for this encounter.    Physical Exam: BP (!) 110/92   Pulse (!) 148   Ht 5\' 11"  (1.803 m)   Wt 86.8 kg   BMI 26.69 kg/m   GEN: Well nourished, well developed in no acute distress NECK: No JVD; No carotid bruits CARDIAC: Irregularly irregular rate and rhythm, no murmurs, rubs, gallops RESPIRATORY:  Clear to auscultation without rales, wheezing or rhonchi  ABDOMEN: Soft, non-tender, non-distended EXTREMITIES:  No edema; No deformity   Wt Readings from Last 3 Encounters:  09/21/23 86.8 kg  05/02/23 80.6 kg  02/12/23 84.4 kg     ECG is not ordered today.  ECG from PCP 09/21/23 reviewed.     Echo 11/10/21 demonstrated   1. Left ventricular ejection fraction, by estimation, is 50 to 55%. The  left ventricle has low normal function. The left ventricle has no regional  wall motion abnormalities. Left ventricular diastolic function could not  be evaluated.   2. Right ventricular systolic function is normal. The right ventricular  size is normal. There is normal pulmonary artery systolic pressure.   3.  Left atrial size was mildly dilated.   4. Right atrial size was mildly dilated.   5. The mitral valve is normal in structure. Trivial mitral valve  regurgitation. No evidence of mitral stenosis.   6. The aortic valve is tricuspid. Aortic valve regurgitation is trivial.  Aortic valve sclerosis/calcification is present, without any evidence of  aortic stenosis.   7. Aortic dilatation noted. There is borderline dilatation of the  ascending aorta, measuring 38 mm.   8. The inferior vena cava is normal in size with greater than 50%  respiratory variability, suggesting right atrial pressure of 3 mmHg.   Comparison(s): No prior Echocardiogram.     CHA2DS2-VASc Score = 3  The patient's score is based upon: CHF History: 0 HTN History: 1 Diabetes History: 0 Stroke History: 0 Vascular Disease History: 1 Age Score: 1 Gender Score: 0       ASSESSMENT AND PLAN: Persistent Atrial Fibrillation/typical atrial flutter The patient's CHA2DS2-VASc score is 3, indicating a 3.2% annual risk of stroke.   S/p afib ablation 02/23/22 Patient appears to be in typical atrial flutter. We discussed rhythm control options. Short term, will arrange for DCCV. He denies any missed doses of anticoagulation in the last 3 weeks. Long term, will refer him back to Dr Elberta Fortis to discuss typical flutter ablation.  Continue bisoprolol 5 mg daily Continue diltiazem 180 mg daily. Will start diltiazem 30 mg PRN q 4 hours for heart racing/elevated heart rates.  Check bmet/cbc today. Continue Eliquis 5 mg BID  Secondary Hypercoagulable State (ICD10:  D68.69) The patient is at significant risk for stroke/thromboembolism based upon his CHA2DS2-VASc Score of 3.  Continue Apixaban (Eliquis). No bleeding issues.  HTN Stable on current regimen  CAD CAC score 412 No anginal symptoms Followed by Dr Excell Seltzer   Follow up with Dr Elberta Fortis post DCCV to discuss ablation.    Informed Consent   Shared Decision Making/Informed  Consent The risks (stroke, cardiac arrhythmias rarely resulting in the need for a temporary or permanent pacemaker, skin irritation or burns and complications associated with conscious sedation including aspiration, arrhythmia, respiratory failure and death), benefits (restoration  of normal sinus rhythm) and alternatives of a direct current cardioversion were explained in detail to Mr. Enrique and he agrees to proceed.        Jorja Loa PA-C [facility] [address] [place], [place] [address] [phone]

## 2023-09-21 NOTE — Telephone Encounter (Signed)
 I spoke with the pt and he will see the Afib clinic today at 3:30 pm.

## 2023-09-21 NOTE — Progress Notes (Signed)
 Primary Care Physician: [name], MD Primary Cardiologist: Tonny Bollman, MD Electrophysiologist: Will Jorja Loa, MD  Referring Physician: Dr Ok Anis is a 68 y.o. male with a history of HLD, HTN, CAD, atrial flutter, atrial fibrillation who presents for follow up in the [facility].  The patient was initially diagnosed with atrial fibrillation 07/2021. He had DCCV 09/2021 which was unsuccessful. He was seen by Dr Elberta Fortis and underwent afib ablation on 02/23/22. Patient is on Eliquis for stroke prevention.   Patient presents today for follow up for atrial flutter. He reports that he noticed an elevated heart rate after exercising over the weekend, rates ~130 bpm. His heart rates became more elevated the next two days up to 140s bpm. Patient went to his PCP office and ECG there showed typical atrial flutter HR 103 bpm. There were no specific triggers that he could identify.   Today, he denies symptoms of chest pain, shortness of breath, orthopnea, PND, lower extremity edema, dizziness, presyncope, syncope, snoring, daytime somnolence, bleeding, or neurologic sequela. The patient is tolerating medications without difficulties and is otherwise without complaint today.    Atrial Fibrillation Risk Factors:  he does not have symptoms or diagnosis of sleep apnea. Negative sleep study 2023. he does not have a history of rheumatic fever.   Atrial Fibrillation Management history:  Previous antiarrhythmic drugs: none Previous cardioversions: 09/2021 Previous ablations: 02/23/22 Anticoagulation history: Eliquis  ROS- All systems are reviewed and negative except as per the HPI above.  Past Medical History:  Diagnosis Date   Family history of premature CAD    Hyperlipidemia    Hypertension    PAF (paroxysmal atrial fibrillation) (HCC)    Prediabetes     Current Outpatient Medications  Medication Sig Dispense Refill   apixaban (ELIQUIS)  5 MG TABS tablet Take  1 tablet  2 x /day (every 12 hours) to prevent Blood Clots                                       /                         TAKE      BYMOUTH 180 tablet 3   bisoprolol (ZEBETA) 5 MG tablet TAKE 1 TABLET BY MOUTH DAILY FOR BLOOD PRESSURE AND HEART RATE/RHYTHM 90 tablet 3   diltiazem (TIAZAC) 180 MG 24 hr capsule TAKE 1 CAPSULE BY MOUTH DAILY FOR BLOOD PRESSURE AND HEART RHYTHM 90 capsule 3   rosuvastatin (CRESTOR) 20 MG tablet Take 1 tablet Daily for Cholesterol (Patient taking differently: Take 10 mg by mouth daily. Take 1 tablet Daily for Cholesterol) 90 tablet 3   tadalafil (CIALIS) 20 MG tablet Take  1/2 to 1 tablet  every 2 to 3 days  if needed for XXXX                    /                                                   TAKE  BY                                   MOUTH 30 tablet 3   No current facility-administered medications for this encounter.    Physical Exam: BP (!) 110/92   Pulse (!) 148   Ht 5\' 11"  (1.803 m)   Wt 86.8 kg   BMI 26.69 kg/m   GEN: Well nourished, well developed in no acute distress NECK: No JVD; No carotid bruits CARDIAC: Irregularly irregular rate and rhythm, no murmurs, rubs, gallops RESPIRATORY:  Clear to auscultation without rales, wheezing or rhonchi  ABDOMEN: Soft, non-tender, non-distended EXTREMITIES:  No edema; No deformity   Wt Readings from Last 3 Encounters:  09/21/23 86.8 kg  05/02/23 80.6 kg  02/12/23 84.4 kg     ECG is not ordered today.  ECG from PCP 09/21/23 reviewed.     Echo 11/10/21 demonstrated   1. Left ventricular ejection fraction, by estimation, is 50 to 55%. The  left ventricle has low normal function. The left ventricle has no regional  wall motion abnormalities. Left ventricular diastolic function could not  be evaluated.   2. Right ventricular systolic function is normal. The right ventricular  size is normal. There is normal pulmonary artery systolic pressure.   3.  Left atrial size was mildly dilated.   4. Right atrial size was mildly dilated.   5. The mitral valve is normal in structure. Trivial mitral valve  regurgitation. No evidence of mitral stenosis.   6. The aortic valve is tricuspid. Aortic valve regurgitation is trivial.  Aortic valve sclerosis/calcification is present, without any evidence of  aortic stenosis.   7. Aortic dilatation noted. There is borderline dilatation of the  ascending aorta, measuring 38 mm.   8. The inferior vena cava is normal in size with greater than 50%  respiratory variability, suggesting right atrial pressure of 3 mmHg.   Comparison(s): No prior Echocardiogram.     CHA2DS2-VASc Score = 3  The patient's score is based upon: CHF History: 0 HTN History: 1 Diabetes History: 0 Stroke History: 0 Vascular Disease History: 1 Age Score: 1 Gender Score: 0       ASSESSMENT AND PLAN: Persistent Atrial Fibrillation/typical atrial flutter The patient's CHA2DS2-VASc score is 3, indicating a 3.2% annual risk of stroke.   S/p afib ablation 02/23/22 Patient appears to be in typical atrial flutter. We discussed rhythm control options. Short term, will arrange for DCCV. He denies any missed doses of anticoagulation in the last 3 weeks. Long term, will refer him back to Dr Elberta Fortis to discuss typical flutter ablation.  Continue bisoprolol 5 mg daily Continue diltiazem 180 mg daily. Will start diltiazem 30 mg PRN q 4 hours for heart racing/elevated heart rates.  Check bmet/cbc today. Continue Eliquis 5 mg BID  Secondary Hypercoagulable State (ICD10:  D68.69) The patient is at significant risk for stroke/thromboembolism based upon his CHA2DS2-VASc Score of 3.  Continue Apixaban (Eliquis). No bleeding issues.  HTN Stable on current regimen  CAD CAC score 412 No anginal symptoms Followed by Dr Excell Seltzer   Follow up with Dr Elberta Fortis post DCCV to discuss ablation.    Informed Consent   Shared Decision Making/Informed  Consent The risks (stroke, cardiac arrhythmias rarely resulting in the need for a temporary or permanent pacemaker, skin irritation or burns and complications associated with conscious sedation including aspiration, arrhythmia, respiratory failure and death), benefits (restoration  of normal sinus rhythm) and alternatives of a direct current cardioversion were explained in detail to Frank Hunt and he agrees to proceed.        Jorja Loa PA-C [facility] [address] [place], [place] [address] [phone]

## 2023-09-22 ENCOUNTER — Encounter (HOSPITAL_COMMUNITY): Payer: Self-pay | Admitting: Cardiology

## 2023-09-22 ENCOUNTER — Ambulatory Visit (HOSPITAL_COMMUNITY): Payer: PPO | Admitting: Certified Registered"

## 2023-09-22 ENCOUNTER — Encounter (HOSPITAL_COMMUNITY)
Admission: RE | Disposition: A | Discharge status: Home / Self Care | Payer: Self-pay | Source: Home / Self Care | Attending: Cardiology

## 2023-09-22 ENCOUNTER — Other Ambulatory Visit: Payer: Self-pay

## 2023-09-22 ENCOUNTER — Ambulatory Visit (HOSPITAL_COMMUNITY)
Admission: RE | Admit: 2023-09-22 | Discharge: 2023-09-22 | Disposition: A | Discharge status: Home / Self Care | Payer: PPO | Attending: Cardiology | Admitting: Cardiology

## 2023-09-22 DIAGNOSIS — Z8249 Family history of ischemic heart disease and other diseases of the circulatory system: Secondary | ICD-10-CM | POA: Diagnosis not present

## 2023-09-22 DIAGNOSIS — I4891 Unspecified atrial fibrillation: Secondary | ICD-10-CM | POA: Diagnosis not present

## 2023-09-22 DIAGNOSIS — I4819 Other persistent atrial fibrillation: Secondary | ICD-10-CM | POA: Insufficient documentation

## 2023-09-22 DIAGNOSIS — Z79899 Other long term (current) drug therapy: Secondary | ICD-10-CM | POA: Diagnosis not present

## 2023-09-22 DIAGNOSIS — I10 Essential (primary) hypertension: Secondary | ICD-10-CM | POA: Insufficient documentation

## 2023-09-22 DIAGNOSIS — I4892 Unspecified atrial flutter: Secondary | ICD-10-CM

## 2023-09-22 DIAGNOSIS — E785 Hyperlipidemia, unspecified: Secondary | ICD-10-CM

## 2023-09-22 DIAGNOSIS — D6869 Other thrombophilia: Secondary | ICD-10-CM | POA: Insufficient documentation

## 2023-09-22 DIAGNOSIS — I483 Typical atrial flutter: Secondary | ICD-10-CM | POA: Diagnosis present

## 2023-09-22 DIAGNOSIS — Z7901 Long term (current) use of anticoagulants: Secondary | ICD-10-CM | POA: Diagnosis not present

## 2023-09-22 DIAGNOSIS — I2510 Atherosclerotic heart disease of native coronary artery without angina pectoris: Secondary | ICD-10-CM | POA: Insufficient documentation

## 2023-09-22 HISTORY — PX: CARDIOVERSION: EP1203

## 2023-09-22 SURGERY — CARDIOVERSION (CATH LAB)
Anesthesia: General

## 2023-09-22 MED ORDER — SODIUM CHLORIDE 0.9% FLUSH: 3.0000 mL | Freq: Two times a day (BID) | INTRAVENOUS | Status: DC

## 2023-09-22 MED ORDER — LIDOCAINE 2% (20 MG/ML) 5 ML SYRINGE
INTRAMUSCULAR | Status: DC | PRN
  Administered 2023-09-22: 20 mg via INTRAVENOUS

## 2023-09-22 MED ORDER — SODIUM CHLORIDE 0.9% FLUSH: 3.0000 mL | INTRAVENOUS | Status: DC | PRN

## 2023-09-22 MED ORDER — PROPOFOL 10 MG/ML IV BOLUS
INTRAVENOUS | Status: DC | PRN
Start: 2023-09-22 — End: 2023-09-22
  Administered 2023-09-22: 70 mg via INTRAVENOUS

## 2023-09-22 SURGICAL SUPPLY — 1 items: PAD DEFIB RADIO PHYSIO CONN (PAD) ×1 IMPLANT

## 2023-09-22 NOTE — Transfer of Care (Signed)
 Immediate Anesthesia Transfer of Care Note  Patient: Frank Hunt  Procedure(s) Performed: CARDIOVERSION  Patient Location: Cath Lab  Anesthesia Type:General  Level of Consciousness: drowsy and patient cooperative  Airway & Oxygen Therapy: Patient Spontanous Breathing and Patient connected to nasal cannula oxygen  Post-op Assessment: Report given to RN, Post -op Vital signs reviewed and stable, and Patient moving all extremities X 4  Post vital signs: Reviewed and stable  Last Vitals:  Vitals Value Taken Time  BP 117/83 09/22/23 1203  Temp    Pulse 87 09/22/23 1203  Resp 11 09/22/23 1203  SpO2 92 % 09/22/23 1203    Last Pain:  Vitals:   09/22/23 1120  TempSrc: Temporal         Complications: No notable events documented.

## 2023-09-22 NOTE — Interval H&P Note (Signed)
 History and Physical Interval Note:  09/22/2023 12:44 PM  Frank Hunt  has presented today for surgery, with the diagnosis of AFIB.  The various methods of treatment have been discussed with the patient and family. After consideration of risks, benefits and other options for treatment, the patient has consented to  Procedure(s): CARDIOVERSION (N/A) as a surgical intervention.  The patient's history has been reviewed, patient examined, no change in status, stable for surgery.  I have reviewed the patient's chart and labs.  Questions were answered to the patient's satisfaction.     [employer]

## 2023-09-22 NOTE — Anesthesia Postprocedure Evaluation (Signed)
 Anesthesia Post Note  Patient: Frank Hunt  Procedure(s) Performed: CARDIOVERSION     Patient location during evaluation: PACU Anesthesia Type: General Level of consciousness: awake and alert Pain management: pain level controlled Vital Signs Assessment: post-procedure vital signs reviewed and stable Respiratory status: spontaneous breathing, nonlabored ventilation, respiratory function stable and patient connected to nasal cannula oxygen Cardiovascular status: blood pressure returned to baseline and stable Postop Assessment: no apparent nausea or vomiting Anesthetic complications: no  No notable events documented.  Last Vitals:  Vitals:   09/22/23 1210 09/22/23 1220  BP: 95/79 106/75  Pulse: 81   Resp: 17 (!) 23  Temp:    SpO2: 94% 93%    Last Pain:  Vitals:   09/22/23 1208  TempSrc:   PainSc: 0-No pain                 Kennieth Rad

## 2023-09-22 NOTE — CV Procedure (Signed)
    Electrical Cardioversion Procedure Note Frank Hunt [id number] 07-06-1956  Procedure: Electrical Cardioversion Indications:  Atrial Flutter  Time Out: Verified patient identification, verified procedure,medications/allergies/relevent history reviewed, required imaging and test results available.  Performed  Procedure Details  The patient was NPO after midnight. Anesthesia was administered at the beside  by Dr.Fitzgerald with propofol.  Cardioversion was performed with synchronized biphasic defibrillation via AP pads with 200 joules.  1 attempt(s) were performed.  The patient converted to normal sinus rhythm. The patient tolerated the procedure well   IMPRESSION:  Successful cardioversion of atrial flutter    Donato Schultz 09/22/2023, 12:04 PM

## 2023-09-22 NOTE — Anesthesia Preprocedure Evaluation (Signed)
 Anesthesia Evaluation  Patient identified by MRN, date of birth, ID band Patient awake    Reviewed: Allergy & Precautions, NPO status , Patient's Chart, lab work & pertinent test results  Airway Mallampati: II  TM Distance: >3 FB Neck ROM: Full    Dental   Pulmonary neg pulmonary ROS   Pulmonary exam normal        Cardiovascular hypertension, Pt. on medications + dysrhythmias  Rhythm:Irregular Rate:Tachycardia     Neuro/Psych  Neuromuscular disease    GI/Hepatic negative GI ROS, Neg liver ROS,,,  Endo/Other  negative endocrine ROS    Renal/GU negative Renal ROS     Musculoskeletal   Abdominal   Peds  Hematology negative hematology ROS (+)   Anesthesia Other Findings   Reproductive/Obstetrics                             Anesthesia Physical Anesthesia Plan  ASA: 2  Anesthesia Plan: General   Post-op Pain Management:    Induction: Intravenous  PONV Risk Score and Plan: 2 and Treatment may vary due to age or medical condition  Airway Management Planned: Natural Airway and Mask  Additional Equipment:   Intra-op Plan:   Post-operative Plan:   Informed Consent: I have reviewed the patients History and Physical, chart, labs and discussed the procedure including the risks, benefits and alternatives for the proposed anesthesia with the patient or authorized representative who has indicated his/her understanding and acceptance.       Plan Discussed with:   Anesthesia Plan Comments:        Anesthesia Quick Evaluation

## 2023-11-02 ENCOUNTER — Encounter: Payer: Self-pay | Admitting: Cardiology

## 2023-11-02 ENCOUNTER — Ambulatory Visit: Attending: Cardiology | Admitting: Cardiology

## 2023-11-02 VITALS — BP 136/70 | HR 70 | Ht 71.0 in | Wt 179.0 lb

## 2023-11-02 DIAGNOSIS — I483 Typical atrial flutter: Secondary | ICD-10-CM

## 2023-11-02 DIAGNOSIS — I4819 Other persistent atrial fibrillation: Secondary | ICD-10-CM

## 2023-11-02 DIAGNOSIS — Z01812 Encounter for preprocedural laboratory examination: Secondary | ICD-10-CM | POA: Diagnosis not present

## 2023-11-02 NOTE — Progress Notes (Signed)
 Electrophysiology Office Note:   Date:  11/02/2023  ID:  Frank Hunt, DOB Sep 28, 1955, MRN [id number]  Primary Cardiologist: Tonny Bollman, MD Primary Heart Failure: None Electrophysiologist: Daizy Outen Jorja Loa, MD      History of Present Illness:   Frank Hunt is a 68 y.o. male with h/o atrial fibrillation, hypertension, hyperlipidemia seen today for routine electrophysiology followup.   He is post ablation for atrial fibrillation.  Unfortunately he has now gone into atrial flutter.  He had a cardioversion but went back into atrial flutter.  Is in sinus rhythm today.  He is feeling well.  When he was in atrial flutter, he had symptoms of fatigue and mild shortness of breath.  Since last being seen in our clinic the patient reports doing well.  he denies chest pain, palpitations, dyspnea, PND, orthopnea, nausea, vomiting, dizziness, syncope, edema, weight gain, or early satiety.   Review of systems complete and found to be negative unless listed in HPI.   EP Information / Studies Reviewed:    EKG is ordered today. Personal review as below.  EKG Interpretation Date/Time:  Wednesday November 02 2023 14:33:12 EDT Ventricular Rate:  70 PR Interval:  204 QRS Duration:  98 QT Interval:  380 QTC Calculation: 410 R Axis:   -81  Text Interpretation: Normal sinus rhythm Incomplete right bundle branch block Left anterior fascicular block When compared with ECG of 22-Sep-2023 12:07, No significant change was found Confirmed by Theo Krumholz ([id number]) on 11/02/2023 2:38:46 PM    Risk Assessment/Calculations:    CHA2DS2-VASc Score = 3   This indicates a 3.2% annual risk of stroke. The patient's score is based upon: CHF History: 0 HTN History: 1 Diabetes History: 0 Stroke History: 0 Vascular Disease History: 1 Age Score: 1 Gender Score: 0             Physical Exam:   VS:  BP 136/70 (BP Location: Left Arm, Patient Position: Sitting, Cuff Size: Large)   Pulse 70   Ht 5\' 11"  (1.803 m)    Wt 179 lb (81.2 kg)   SpO2 96%   BMI 24.97 kg/m    Wt Readings from Last 3 Encounters:  11/02/23 179 lb (81.2 kg)  09/22/23 190 lb (86.2 kg)  09/21/23 191 lb 6.4 oz (86.8 kg)     GEN: Well nourished, well developed in no acute distress NECK: No JVD; No carotid bruits CARDIAC: Regular rate and rhythm, no murmurs, rubs, gallops RESPIRATORY:  Clear to auscultation without rales, wheezing or rhonchi  ABDOMEN: Soft, non-tender, non-distended EXTREMITIES:  No edema; No deformity   ASSESSMENT AND PLAN:    1.  Persistent atrial fibrillation: Post ablation 02/23/2022.  Remains in sinus rhythm without recurrence.  2.  Secondary hypercoagulable state: Currently on Eliquis for atrial fibrillation  3.  Typical atrial flutter: Occurred post ablation for atrial fibrillation.  He would prefer rhythm control and would like to avoid antiarrhythmic medications.  Due to that, we Alexxis Mackert plan for ablation.  Risks and benefits have been discussed.  He understands the risks and is agreed to the procedure.  During the ablation, we Robbie Rideaux plan for left atrial access to ensure that there is been no reconnection from his prior A-fib ablation.  Risk, benefits, and alternatives to EP study and radiofrequency/pulse field ablation for afib were also discussed in detail today. These risks include but are not limited to stroke, bleeding, vascular damage, tamponade, perforation, damage to the esophagus, lungs, and other structures, pulmonary vein stenosis, worsening renal function,  and death. The patient understands these risk and wishes to proceed.  We Phinley Schall therefore proceed with catheter ablation at the next available time.  Carto, ICE, anesthesia are requested for the procedure.  Keishawna Carranza also obtain CT PV protocol prior to the procedure to exclude LAA thrombus and further evaluate atrial anatomy.  Follow up with [facility] Clinic as usual post procedure  Signed, Jayleena Stille Jorja Loa, MD

## 2023-11-02 NOTE — Patient Instructions (Signed)
 Medication Instructions:  Your physician recommends that you continue on your current medications as directed. Please refer to the Current Medication list given to you today.  *If you need a refill on your cardiac medications before your next appointment, please call your pharmacy*  Lab Work: CBC and BMET - please have pre-procedure lab work completed on Monday, 01/23/2024 . This can be done at ANY [facility] near you - no appointment required and this does not have to be fasting. If you have labs (blood work) drawn today and your tests are completely normal, you will receive your results only by: MyChart Message (if you have MyChart) OR A paper copy in the mail If you have any lab test that is abnormal or we need to change your treatment, we will call you to review the results.  Testing/Procedures: Cardiac CT - someone will contact you to schedule this  Your physician has requested that you have cardiac CT. Cardiac computed tomography (CT) is a painless test that uses an x-ray machine to take clear, detailed pictures of your heart. For further information please visit [email or web address] Please follow instruction sheet as given.   Atrial Fibrillation Ablation - scheduled on Thursday, 02/02/2024. We will be in contact closer to your ablation date with further instructions Your physician has recommended that you have an ablation. Catheter ablation is a medical procedure used to treat some cardiac arrhythmias (irregular heartbeats). During catheter ablation, a long, thin, flexible tube is put into a blood vessel in your groin (upper thigh), or neck. This tube is called an ablation catheter. It is then guided to your heart through the blood vessel. Radio frequency waves destroy small areas of heart tissue where abnormal heartbeats may cause an arrhythmia to start. Please see the instruction sheet given to you today.  Follow-Up: At [facility], you and your health needs are our priority.  As  part of our continuing mission to provide you with exceptional heart care, our providers are all part of one team.  This team includes your primary Cardiologist (physician) and Advanced Practice Providers or APPs (Physician Assistants and Nurse Practitioners) who all work together to provide you with the care you need, when you need it.  Your next appointment:   We will schedule follow up after your ablation  Provider:   Dr Elberta Fortis   Cardiac Ablation Cardiac ablation is a procedure to destroy, or ablate, a small amount of heart tissue that is causing problems. The heart has many electrical connections. Sometimes, these connections are abnormal and can cause the heart to beat very fast or irregularly. Ablating the abnormal areas can improve the heart's rhythm or return it to normal. Ablation may be done for people who: Have irregular or rapid heartbeats (arrhythmias). Have Wolff-Parkinson-White syndrome. Have taken medicines for an arrhythmia that did not work or caused side effects. Have a high-risk heartbeat that may be life-threatening. Tell a health care provider about: Any allergies you have. All medicines you are taking, including vitamins, herbs, eye drops, creams, and over-the-counter medicines. Any problems you or family members have had with anesthesia. Any bleeding problems you have. Any surgeries you have had. Any medical conditions you have. Whether you are pregnant or may be pregnant. What are the risks? Your health care provider will talk with you about risks. These may include: Infection. Bruising and bleeding. Stroke or blood clots. Damage to nearby structures or organs. Allergic reaction to medicines or dyes. Needing a pacemaker if the heart gets damaged. A pacemaker  is a device that helps the heart beat normally. Failure of the procedure. A repeat procedure may be needed. What happens before the procedure? Medicines Ask your health care provider about: Changing or  stopping your regular medicines. These include any heart rhythm medicines, diabetes medicines, or blood thinners you take. Taking medicines such as aspirin and ibuprofen. These medicines can thin your blood. Do not take them unless your health care provider tells you to. Taking over-the-counter medicines, vitamins, herbs, and supplements. General instructions Follow instructions from your health care provider about what you may eat and drink. If you will be going home right after the procedure, plan to have a responsible adult: Take you home from the hospital or clinic. You will not be allowed to drive. Care for you for the time you are told. Ask your health care provider what steps will be taken to prevent infection. What happens during the procedure?  An IV will be inserted into one of your veins. You may be given: A sedative. This helps you relax. Anesthesia. This will: Numb certain areas of your body. An incision will be made in your neck or your groin. A needle will be inserted through the incision and into a large vein in your neck or groin. The small, thin tube (catheter) will be inserted through the needle and moved to your heart. A type of X-ray (fluoroscopy) will be used to help guide the catheter and provide images of the heart on a monitor. Dye may be injected through the catheter to help your surgeon see the area of the heart that needs treatment. Electrical currents will be sent from the catheter to destroy heart tissue in certain areas. There are three types of energy that may be used to do this: Heat (radiofrequency energy). Laser energy. Extreme cold (cryoablation). When the tissue has been destroyed, the catheter will be removed. Pressure will be held on the insertion area to prevent bleeding. A bandage (dressing) will be placed over the insertion area. The procedure may vary among health care providers and hospitals. What happens after the procedure? Your blood  pressure, heart rate and rhythm, breathing rate, and blood oxygen level will be monitored until you leave the hospital or clinic. Your insertion area will be checked for bleeding. You will need to lie still for a few hours. If your groin was used, you will need to keep your leg straight for a few hours after the catheter is removed. This information is not intended to replace advice given to you by your health care provider. Make sure you discuss any questions you have with your health care provider. Document Revised: 12/29/2021 Document Reviewed: 12/29/2021 Elsevier Patient Education  2024 [employer].

## 2023-11-04 NOTE — Addendum Note (Signed)
 Addended by: Alois Cliche on: 11/04/2023 01:28 PM   Modules accepted: Orders

## 2023-12-29 ENCOUNTER — Ambulatory Visit: Admitting: Internal Medicine

## 2023-12-29 ENCOUNTER — Encounter: Payer: Self-pay | Admitting: Internal Medicine

## 2023-12-29 VITALS — BP 122/62 | HR 62 | Temp 98.0°F | Ht 71.0 in | Wt 179.0 lb

## 2023-12-29 DIAGNOSIS — I2583 Coronary atherosclerosis due to lipid rich plaque: Secondary | ICD-10-CM

## 2023-12-29 DIAGNOSIS — E782 Mixed hyperlipidemia: Secondary | ICD-10-CM

## 2023-12-29 DIAGNOSIS — M5440 Lumbago with sciatica, unspecified side: Secondary | ICD-10-CM

## 2023-12-29 DIAGNOSIS — R350 Frequency of micturition: Secondary | ICD-10-CM

## 2023-12-29 DIAGNOSIS — I480 Paroxysmal atrial fibrillation: Secondary | ICD-10-CM

## 2023-12-29 DIAGNOSIS — E559 Vitamin D deficiency, unspecified: Secondary | ICD-10-CM | POA: Diagnosis not present

## 2023-12-29 DIAGNOSIS — R7303 Prediabetes: Secondary | ICD-10-CM

## 2023-12-29 DIAGNOSIS — L989 Disorder of the skin and subcutaneous tissue, unspecified: Secondary | ICD-10-CM

## 2023-12-29 DIAGNOSIS — N401 Enlarged prostate with lower urinary tract symptoms: Secondary | ICD-10-CM

## 2023-12-29 DIAGNOSIS — I2510 Atherosclerotic heart disease of native coronary artery without angina pectoris: Secondary | ICD-10-CM | POA: Insufficient documentation

## 2023-12-29 DIAGNOSIS — N5201 Erectile dysfunction due to arterial insufficiency: Secondary | ICD-10-CM

## 2023-12-29 DIAGNOSIS — Z1211 Encounter for screening for malignant neoplasm of colon: Secondary | ICD-10-CM

## 2023-12-29 DIAGNOSIS — G8929 Other chronic pain: Secondary | ICD-10-CM

## 2023-12-29 DIAGNOSIS — R0989 Other specified symptoms and signs involving the circulatory and respiratory systems: Secondary | ICD-10-CM

## 2023-12-29 DIAGNOSIS — N400 Enlarged prostate without lower urinary tract symptoms: Secondary | ICD-10-CM | POA: Insufficient documentation

## 2023-12-29 DIAGNOSIS — I10 Essential (primary) hypertension: Secondary | ICD-10-CM

## 2023-12-29 MED ORDER — TADALAFIL 5 MG PO TABS: 5.0000 mg | ORAL_TABLET | Freq: Every day | ORAL | 11 refills | Status: DC

## 2023-12-29 NOTE — Assessment & Plan Note (Signed)
 A fib - ablation is pending 02/02/2024

## 2023-12-29 NOTE — Progress Notes (Signed)
 Subjective:  Patient ID: Frank Hunt, male    DOB: 08/27/1955  Age: 68 y.o. MRN: [id number]  CC: New Patient (Initial Visit)   HPI Frank Hunt presents for a new pt  A fib - ablation is pending 02/02/2024 C/o BPH   Outpatient Medications Prior to Visit  Medication Sig Dispense Refill   apixaban  (ELIQUIS ) 5 MG TABS tablet Take  1 tablet  2 x /day (every 12 hours) to prevent Blood Clots                                       /                         TAKE      BYMOUTH 180 tablet 3   bisoprolol  (ZEBETA ) 5 MG tablet TAKE 1 TABLET BY MOUTH DAILY FOR BLOOD PRESSURE AND HEART RATE/RHYTHM 90 tablet 3   Cholecalciferol (VITAMIN D3) 250 MCG (10000 UT) capsule Take 10,000 Units by mouth daily.     diltiazem  (CARDIZEM ) 30 MG tablet Cardizem  30mg  -- Take 1 tablet every 4 hours AS NEEDED for AFIB heart rate >100 as long as top BP >100. 30 tablet 1   diltiazem  (TIAZAC ) 180 MG 24 hr capsule TAKE 1 CAPSULE BY MOUTH DAILY FOR BLOOD PRESSURE AND HEART RHYTHM 90 capsule 3   Magnesium 200 MG TABS Take 400 mg by mouth daily.     Menaquinone-7 (VITAMIN K2 PO) Take 1 tablet by mouth daily.     rosuvastatin  (CRESTOR ) 20 MG tablet Take 1 tablet Daily for Cholesterol (Patient taking differently: Take 10 mg by mouth daily. Take 1 tablet Daily for Cholesterol) 90 tablet 3   tadalafil  (CIALIS ) 20 MG tablet Take  1/2 to 1 tablet  every 2 to 3 days  if needed for XXXX                    /                                                   TAKE                                   BY                                   MOUTH 30 tablet 3   No facility-administered medications prior to visit.    ROS: Review of Systems  Constitutional:  Negative for appetite change, fatigue and unexpected weight change.  HENT:  Negative for congestion, nosebleeds, sneezing, sore throat and trouble swallowing.   Eyes:  Negative for itching and visual disturbance.  Respiratory:  Negative for cough.   Cardiovascular:  Negative for chest  pain, palpitations and leg swelling.  Gastrointestinal:  Negative for abdominal distention, blood in stool, diarrhea and nausea.  Genitourinary:  Positive for frequency and urgency. Negative for hematuria.  Musculoskeletal:  Negative for back pain, gait problem, joint swelling and neck pain.  Skin:  Negative for rash.  Neurological:  Negative for dizziness, tremors, speech difficulty and weakness.  Psychiatric/Behavioral:  Negative for agitation, dysphoric mood, sleep disturbance and suicidal ideas. The patient is not nervous/anxious.     Objective:  BP 122/62   Pulse 62   Temp 98 F (36.7 C) (Oral)   Ht 5' 11 (1.803 m)   Wt 179 lb (81.2 kg)   SpO2 98%   BMI 24.97 kg/m   BP Readings from Last 3 Encounters:  12/29/23 122/62  11/02/23 136/70  09/22/23 106/75    Wt Readings from Last 3 Encounters:  12/29/23 179 lb (81.2 kg)  11/02/23 179 lb (81.2 kg)  09/22/23 190 lb (86.2 kg)    Physical Exam Constitutional:      General: He is not in acute distress.    Appearance: He is well-developed.     Comments: NAD   Eyes:     Conjunctiva/sclera: Conjunctivae normal.     Pupils: Pupils are equal, round, and reactive to light.   Neck:     Thyroid : No thyromegaly.     Vascular: No JVD.   Cardiovascular:     Rate and Rhythm: Normal rate and regular rhythm.     Heart sounds: Normal heart sounds. No murmur heard.    No friction rub. No gallop.  Pulmonary:     Effort: Pulmonary effort is normal. No respiratory distress.     Breath sounds: Normal breath sounds. No wheezing or rales.  Chest:     Chest wall: No tenderness.  Abdominal:     General: Bowel sounds are normal. There is no distension.     Palpations: Abdomen is soft. There is no mass.     Tenderness: There is no abdominal tenderness. There is no guarding or rebound.   Musculoskeletal:        General: No tenderness. Normal range of motion.     Cervical back: Normal range of motion.     Right lower leg: No edema.      Left lower leg: No edema.  Lymphadenopathy:     Cervical: No cervical adenopathy.   Skin:    General: Skin is warm and dry.     Findings: No rash.   Neurological:     Mental Status: He is alert and oriented to person, place, and time.     Cranial Nerves: No cranial nerve deficit.     Motor: No abnormal muscle tone.     Coordination: Coordination normal.     Gait: Gait normal.     Deep Tendon Reflexes: Reflexes are normal and symmetric.   Psychiatric:        Behavior: Behavior normal.        Thought Content: Thought content normal.        Judgment: Judgment normal.   Multiple hyperkeratotic lesions on the extremities  Lab Results  Component Value Date   WBC 6.6 01/02/2024   HGB 15.3 01/02/2024   HCT 46.7 01/02/2024   PLT 180.0 01/02/2024   GLUCOSE 84 01/02/2024   CHOL 183 01/02/2024   TRIG 79.0 01/02/2024   HDL 71.80 01/02/2024   LDLCALC 95 01/02/2024   ALT 24 01/02/2024   AST 26 01/02/2024   NA 136 01/02/2024   K 3.9 01/02/2024   CL 102 01/02/2024   CREATININE 1.26 01/02/2024   BUN 17 01/02/2024   CO2 27 01/02/2024   TSH 2.56 01/02/2024   PSA 1.17 01/02/2024   INR 1.1 09/11/2021   HGBA1C 5.8 01/02/2024   MICROALBUR 0.2 04/20/2023    EP STUDY Result Date: 09/22/2023 See surgical note  for result.   Assessment & Plan:   Problem List Items Addressed This Visit     Hyperlipidemia, mixed   Frank Hunt has a prescription for Crestor .  He is not taking it.  The patient declined other statins      Relevant Medications   tadalafil  (CIALIS ) 5 MG tablet   Prediabetes   Check CMET, A1c      Relevant Orders   Hemoglobin A1c (Completed)   Vitamin D  deficiency   On Vit D      Essential hypertension   BP Readings from Last 3 Encounters:  12/29/23 122/62  11/02/23 136/70  09/22/23 106/75  Normal blood pressure       Relevant Medications   tadalafil  (CIALIS ) 5 MG tablet   Chronic low back pain   Stable      PAF (paroxysmal atrial fibrillation) (HCC)  - Primary   A fib - ablation is pending 02/02/2024      Relevant Medications   tadalafil  (CIALIS ) 5 MG tablet   Other Relevant Orders   TSH (Completed)   Urinalysis (Completed)   CBC with Differential/Platelet (Completed)   Lipid panel (Completed)   Comprehensive metabolic panel with GFR (Completed)   Coronary atherosclerosis   Coronary CT score - 412 in [address]  Crestor  - achy Frank Hunt has a prescription for Crestor .  He is not taking it.  The patient declined other statins Follow-up with cardiology      Relevant Medications   tadalafil  (CIALIS ) 5 MG tablet   Other Relevant Orders   Lipid panel (Completed)   BPH (benign prostatic hyperplasia)   Start Cialis  5 mg/d      Relevant Orders   PSA (Completed)   Skin lesions, generalized   Hyperkeratotic lesions.  Sun protection advised.  Follow-up with dermatology      Other Visit Diagnoses       Erectile dysfunction due to arterial insufficiency       Relevant Medications   tadalafil  (CIALIS ) 5 MG tablet     Screening for colon cancer       Relevant Orders   Cologuard     Bilateral carotid bruits       Relevant Orders   US  Carotid Bilateral (Completed)         Meds ordered this encounter  Medications   tadalafil  (CIALIS ) 5 MG tablet    Sig: Take 1 tablet (5 mg total) by mouth daily.    Dispense:  30 tablet    Refill:  11      Follow-up: Return in about 6 months (around 06/29/2024) for a follow-up visit.  Anitra Barn, MD

## 2023-12-29 NOTE — Assessment & Plan Note (Addendum)
 Check CMET, A1c

## 2023-12-29 NOTE — Assessment & Plan Note (Addendum)
 Coronary CT score - 412 in [address]  Crestor  - achy Frank Hunt has a prescription for Crestor .  He is not taking it.  The patient declined other statins Follow-up with cardiology

## 2023-12-29 NOTE — Assessment & Plan Note (Signed)
 On Vit D

## 2023-12-29 NOTE — Assessment & Plan Note (Addendum)
Start Cialis 5 mg/d 

## 2023-12-30 ENCOUNTER — Ambulatory Visit
Admission: RE | Admit: 2023-12-30 | Discharge: 2023-12-30 | Disposition: A | Discharge status: Home / Self Care | Source: Ambulatory Visit | Attending: Internal Medicine | Admitting: Internal Medicine

## 2023-12-30 DIAGNOSIS — R0989 Other specified symptoms and signs involving the circulatory and respiratory systems: Secondary | ICD-10-CM

## 2024-01-01 ENCOUNTER — Ambulatory Visit: Payer: Self-pay | Admitting: Internal Medicine

## 2024-01-01 DIAGNOSIS — N401 Enlarged prostate with lower urinary tract symptoms: Secondary | ICD-10-CM

## 2024-01-01 DIAGNOSIS — I480 Paroxysmal atrial fibrillation: Secondary | ICD-10-CM

## 2024-01-02 ENCOUNTER — Other Ambulatory Visit (INDEPENDENT_AMBULATORY_CARE_PROVIDER_SITE_OTHER)

## 2024-01-02 DIAGNOSIS — I2583 Coronary atherosclerosis due to lipid rich plaque: Secondary | ICD-10-CM

## 2024-01-02 DIAGNOSIS — N401 Enlarged prostate with lower urinary tract symptoms: Secondary | ICD-10-CM | POA: Diagnosis not present

## 2024-01-02 DIAGNOSIS — R350 Frequency of micturition: Secondary | ICD-10-CM

## 2024-01-02 DIAGNOSIS — I480 Paroxysmal atrial fibrillation: Secondary | ICD-10-CM | POA: Diagnosis not present

## 2024-01-02 DIAGNOSIS — R7303 Prediabetes: Secondary | ICD-10-CM | POA: Diagnosis not present

## 2024-01-02 LAB — CBC WITH DIFFERENTIAL/PLATELET
Basophils Absolute: 0 10*3/uL (ref 0.0–0.1)
Basophils Relative: 0.6 % (ref 0.0–3.0)
Eosinophils Absolute: 0.1 10*3/uL (ref 0.0–0.7)
Eosinophils Relative: 1.9 % (ref 0.0–5.0)
HCT: 46.7 % (ref 39.0–52.0)
Hemoglobin: 15.3 g/dL (ref 13.0–17.0)
Lymphocytes Relative: 22 % (ref 12.0–46.0)
Lymphs Abs: 1.5 10*3/uL (ref 0.7–4.0)
MCHC: 32.7 g/dL (ref 30.0–36.0)
MCV: 91 fl (ref 78.0–100.0)
Monocytes Absolute: 0.7 10*3/uL (ref 0.1–1.0)
Monocytes Relative: 10.4 % (ref 3.0–12.0)
Neutro Abs: 4.3 10*3/uL (ref 1.4–7.7)
Neutrophils Relative %: 65.1 % (ref 43.0–77.0)
Platelets: 180 10*3/uL (ref 150.0–400.0)
RBC: 5.13 Mil/uL (ref 4.22–5.81)
RDW: 13.7 % (ref 11.5–15.5)
WBC: 6.6 10*3/uL (ref 4.0–10.5)

## 2024-01-02 LAB — URINALYSIS
Bilirubin Urine: NEGATIVE
Ketones, ur: NEGATIVE
Leukocytes,Ua: NEGATIVE
Nitrite: NEGATIVE
Specific Gravity, Urine: <=1.005 — AB (ref 1.000–1.030)
Total Protein, Urine: NEGATIVE
Urine Glucose: NEGATIVE
Urobilinogen, UA: 0.2 (ref 0.0–1.0)
pH: 6 (ref 5.0–8.0)

## 2024-01-02 LAB — COMPREHENSIVE METABOLIC PANEL WITH GFR
ALT: 24 U/L (ref 0–53)
AST: 26 U/L (ref 0–37)
Albumin: 3.8 g/dL (ref 3.5–5.2)
Alkaline Phosphatase: 52 U/L (ref 39–117)
BUN: 17 mg/dL (ref 6–23)
CO2: 27 meq/L (ref 19–32)
Calcium: 8.7 mg/dL (ref 8.4–10.5)
Chloride: 102 meq/L (ref 96–112)
Creatinine, Ser: 1.26 mg/dL (ref 0.40–1.50)
GFR: 57.86 mL/min — ABNORMAL LOW (ref >60.00)
Glucose, Bld: 84 mg/dL (ref 70–99)
Potassium: 3.9 meq/L (ref 3.5–5.1)
Sodium: 136 meq/L (ref 135–145)
Total Bilirubin: 1 mg/dL (ref 0.2–1.2)
Total Protein: 6.2 g/dL (ref 6.0–8.3)

## 2024-01-02 LAB — HEMOGLOBIN A1C: Hgb A1c MFr Bld: 5.8 % (ref 4.6–6.5)

## 2024-01-02 LAB — LIPID PANEL
Cholesterol: 183 mg/dL (ref 0–200)
HDL: 71.8 mg/dL (ref >39.00)
LDL Cholesterol: 95 mg/dL (ref 0–99)
NonHDL: 110.99
Total CHOL/HDL Ratio: 3
Triglycerides: 79 mg/dL (ref 0.0–149.0)
VLDL: 15.8 mg/dL (ref 0.0–40.0)

## 2024-01-05 LAB — PSA: PSA: 1.17 ng/mL (ref 0.10–4.00)

## 2024-01-05 LAB — TSH: TSH: 2.56 u[IU]/mL (ref 0.35–5.50)

## 2024-01-06 ENCOUNTER — Ambulatory Visit (HOSPITAL_COMMUNITY)
Admission: RE | Admit: 2024-01-06 | Discharge: 2024-01-06 | Disposition: A | Discharge status: Home / Self Care | Source: Ambulatory Visit | Attending: Cardiology | Admitting: Cardiology

## 2024-01-06 DIAGNOSIS — I483 Typical atrial flutter: Secondary | ICD-10-CM | POA: Diagnosis not present

## 2024-01-06 DIAGNOSIS — I4819 Other persistent atrial fibrillation: Secondary | ICD-10-CM | POA: Insufficient documentation

## 2024-01-06 MED ORDER — IOHEXOL 350 MG/ML SOLN
100.0000 mL | Freq: Once | INTRAVENOUS | Status: AC | PRN
  Administered 2024-01-06: 100 mL via INTRAVENOUS

## 2024-01-09 ENCOUNTER — Encounter: Payer: Self-pay | Admitting: Internal Medicine

## 2024-01-09 ENCOUNTER — Encounter: Payer: Self-pay | Admitting: Cardiovascular Disease

## 2024-01-09 DIAGNOSIS — L989 Disorder of the skin and subcutaneous tissue, unspecified: Secondary | ICD-10-CM | POA: Insufficient documentation

## 2024-01-09 NOTE — Assessment & Plan Note (Signed)
 Hyperkeratotic lesions.  Sun protection advised.  Follow-up with dermatology

## 2024-01-09 NOTE — Assessment & Plan Note (Signed)
 Stable

## 2024-01-09 NOTE — Assessment & Plan Note (Signed)
 Frank Hunt has a prescription for Crestor .  He is not taking it.  The patient declined other statins

## 2024-01-09 NOTE — Assessment & Plan Note (Signed)
 BP Readings from Last 3 Encounters:  12/29/23 122/62  11/02/23 136/70  09/22/23 106/75  Normal blood pressure

## 2024-01-10 ENCOUNTER — Ambulatory Visit: Payer: Self-pay | Admitting: Cardiology

## 2024-01-12 ENCOUNTER — Telehealth: Payer: Self-pay

## 2024-01-12 ENCOUNTER — Other Ambulatory Visit (HOSPITAL_COMMUNITY)

## 2024-01-12 NOTE — Telephone Encounter (Signed)
 Pre-procedure call :   Called patient, NA, left message on VM to contact our office and speak to Solomon Dupre or April G.

## 2024-01-19 ENCOUNTER — Telehealth: Payer: Self-pay | Admitting: Internal Medicine

## 2024-01-19 NOTE — Telephone Encounter (Unsigned)
 Copied from CRM [id number]. Topic: General - Other >> Jan 19, 2024  2:40 PM Gennette ORN wrote: Reason for CRM: Patient called is stating he has a call back but no documentation please contact patient back.

## 2024-01-23 LAB — CBC
Hematocrit: 50.1 % (ref 37.5–51.0)
Hemoglobin: 15.7 g/dL (ref 13.0–17.7)
MCH: 30.3 pg (ref 26.6–33.0)
MCHC: 31.3 g/dL — ABNORMAL LOW (ref 31.5–35.7)
MCV: 97 fL (ref 79–97)
Platelets: 222 10*3/uL (ref 150–450)
RBC: 5.19 x10E6/uL (ref 4.14–5.80)
RDW: 12.3 % (ref 11.6–15.4)
WBC: 7 10*3/uL (ref 3.4–10.8)

## 2024-01-24 ENCOUNTER — Telehealth (HOSPITAL_COMMUNITY): Payer: Self-pay

## 2024-01-24 LAB — BASIC METABOLIC PANEL WITH GFR
BUN/Creatinine Ratio: 12 (ref 10–24)
BUN: 16 mg/dL (ref 8–27)
CO2: 20 mmol/L (ref 20–29)
Calcium: 9.4 mg/dL (ref 8.6–10.2)
Chloride: 103 mmol/L (ref 96–106)
Creatinine, Ser: 1.33 mg/dL — ABNORMAL HIGH (ref 0.76–1.27)
Glucose: 86 mg/dL (ref 70–99)
Potassium: 4.7 mmol/L (ref 3.5–5.2)
Sodium: 142 mmol/L (ref 134–144)
eGFR: 58 mL/min/{1.73_m2} — ABNORMAL LOW (ref >59)

## 2024-01-24 NOTE — Telephone Encounter (Signed)
 Spoke with patient to discuss upcoming procedure.   CT: completed.  Labs: completed.   Any recent signs of acute illness or been started on antibiotics? No Any new medications started? No Any medications to hold? No Any missed doses of blood thinner? No Advised patient to continue taking ANTICOAGULANT: Eliquis  (Apixaban ) twice daily without missing any doses.  Medication instructions:  On the morning of your procedure DO NOT take any medication., including Eliquis  or the procedure may be rescheduled. Nothing to eat or drink after midnight prior to your procedure.  Confirmed patient is scheduled for Atrial Fibrillation Ablation on Thursday, July 10 with Dr. Soyla Norton. Instructed patient to arrive at the Main Entrance A at [facility]: [address] [place], [place] [address] and check in at Admitting at 5:30 AM.  Advised of plan to go home the same day and will only stay overnight if medically necessary. You MUST have a responsible adult to drive you home and MUST be with you the first 24 hours after you arrive home or your procedure could be cancelled.  Patient verbalized understanding to all instructions provided and agreed to proceed with procedure.

## 2024-02-01 NOTE — Anesthesia Preprocedure Evaluation (Signed)
 Anesthesia Evaluation  Patient identified by MRN, date of birth, ID band Patient awake    Reviewed: Allergy & Precautions, NPO status , Patient's Chart, lab work & pertinent test results  History of Anesthesia Complications Negative for: history of anesthetic complications  Airway Mallampati: II  TM Distance: >3 FB Neck ROM: Full    Dental no notable dental hx. (+) Teeth Intact   Pulmonary neg pulmonary ROS, neg sleep apnea, neg COPD, Patient abstained from smoking.Not current smoker   Pulmonary exam normal breath sounds clear to auscultation       Cardiovascular Exercise Tolerance: Good METShypertension, + CAD  (-) Past MI + dysrhythmias (eliquis ) Atrial Fibrillation  Rhythm:Irregular Rate:Normal - Systolic murmurs Echo 2023 1. Left ventricular ejection fraction, by estimation, is 50 to 55%. The  left ventricle has low normal function. The left ventricle has no regional  wall motion abnormalities. Left ventricular diastolic function could not  be evaluated.   2. Right ventricular systolic function is normal. The right ventricular  size is normal. There is normal pulmonary artery systolic pressure.   3. Left atrial size was mildly dilated.   4. Right atrial size was mildly dilated.   5. The mitral valve is normal in structure. Trivial mitral valve  regurgitation. No evidence of mitral stenosis.   6. The aortic valve is tricuspid. Aortic valve regurgitation is trivial.  Aortic valve sclerosis/calcification is present, without any evidence of  aortic stenosis.   7. Aortic dilatation noted. There is borderline dilatation of the  ascending aorta, measuring 38 mm.   8. The inferior vena cava is normal in size with greater than 50%  respiratory variability, suggesting right atrial pressure of 3 mmHg.     Neuro/Psych  PSYCHIATRIC DISORDERS       Neuromuscular disease    GI/Hepatic negative GI ROS, Neg liver ROS,neg GERD  ,,   Endo/Other  diabetes (prediabetic)    Renal/GU Renal InsufficiencyRenal diseaseCr 1.33  negative genitourinary   Musculoskeletal negative musculoskeletal ROS (+)    Abdominal   Peds  Hematology negative hematology ROS (+)   Anesthesia Other Findings Past Medical History: No date: Family history of premature CAD No date: Hyperlipidemia No date: Hypertension No date: PAF (paroxysmal atrial fibrillation) (HCC) No date: Prediabetes  Reproductive/Obstetrics negative OB ROS                              Anesthesia Physical Anesthesia Plan  ASA: 3  Anesthesia Plan: General   Post-op Pain Management: Minimal or no pain anticipated   Induction: Intravenous  PONV Risk Score and Plan: 2 and Ondansetron , Dexamethasone  and Treatment may vary due to age or medical condition  Airway Management Planned: Oral ETT  Additional Equipment: None  Intra-op Plan:   Post-operative Plan: Extubation in OR  Informed Consent: I have reviewed the patients History and Physical, chart, labs and discussed the procedure including the risks, benefits and alternatives for the proposed anesthesia with the patient or authorized representative who has indicated his/her understanding and acceptance.     Dental advisory given  Plan Discussed with: CRNA and Surgeon  Anesthesia Plan Comments: (Discussed risks of anesthesia with patient, including PONV, sore throat, lip/dental/eye damage. Rare risks discussed as well, such as cardiorespiratory and neurological sequelae, and allergic reactions. Discussed the role of CRNA in patient's perioperative care. Patient understands.)         Anesthesia Quick Evaluation

## 2024-02-01 NOTE — Pre-Procedure Instructions (Signed)
 Instructed patient on the following items: Arrival time 0515 Nothing to eat or drink after midnight No meds AM of procedure Responsible person to drive you home and stay with you for 24 hrs  Have you missed any doses of anti-coagulant Eliquis- takes twice a day, hasn't missed any doses.  Don't take dose morning of procedure.

## 2024-02-02 ENCOUNTER — Encounter (HOSPITAL_COMMUNITY): Payer: Self-pay | Admitting: Anesthesiology

## 2024-02-02 ENCOUNTER — Ambulatory Visit (HOSPITAL_COMMUNITY)
Admission: RE | Admit: 2024-02-02 | Discharge: 2024-02-02 | Disposition: A | Discharge status: Home / Self Care | Attending: Cardiology | Admitting: Cardiology

## 2024-02-02 ENCOUNTER — Other Ambulatory Visit: Payer: Self-pay

## 2024-02-02 ENCOUNTER — Ambulatory Visit (HOSPITAL_BASED_OUTPATIENT_CLINIC_OR_DEPARTMENT_OTHER): Payer: Self-pay | Admitting: Anesthesiology

## 2024-02-02 ENCOUNTER — Encounter (HOSPITAL_COMMUNITY): Payer: Self-pay | Admitting: Cardiology

## 2024-02-02 ENCOUNTER — Encounter (HOSPITAL_COMMUNITY)
Admission: RE | Disposition: A | Discharge status: Home / Self Care | Payer: Self-pay | Source: Home / Self Care | Attending: Cardiology

## 2024-02-02 DIAGNOSIS — I483 Typical atrial flutter: Secondary | ICD-10-CM

## 2024-02-02 DIAGNOSIS — E785 Hyperlipidemia, unspecified: Secondary | ICD-10-CM

## 2024-02-02 DIAGNOSIS — I10 Essential (primary) hypertension: Secondary | ICD-10-CM

## 2024-02-02 DIAGNOSIS — I2510 Atherosclerotic heart disease of native coronary artery without angina pectoris: Secondary | ICD-10-CM

## 2024-02-02 DIAGNOSIS — R7303 Prediabetes: Secondary | ICD-10-CM | POA: Insufficient documentation

## 2024-02-02 DIAGNOSIS — I4891 Unspecified atrial fibrillation: Secondary | ICD-10-CM | POA: Diagnosis not present

## 2024-02-02 DIAGNOSIS — I4819 Other persistent atrial fibrillation: Secondary | ICD-10-CM

## 2024-02-02 DIAGNOSIS — Z79899 Other long term (current) drug therapy: Secondary | ICD-10-CM | POA: Diagnosis not present

## 2024-02-02 HISTORY — PX: ATRIAL FIBRILLATION ABLATION: EP1191

## 2024-02-02 LAB — POCT ACTIVATED CLOTTING TIME: Activated Clotting Time: 325 s

## 2024-02-02 SURGERY — ATRIAL FIBRILLATION ABLATION
Anesthesia: General

## 2024-02-02 MED ORDER — ONDANSETRON HCL 4 MG/2ML IJ SOLN: 4.0000 mg | Freq: Four times a day (QID) | INTRAMUSCULAR | Status: DC | PRN

## 2024-02-02 MED ORDER — PHENYLEPHRINE HCL-NACL 20-0.9 MG/250ML-% IV SOLN
INTRAVENOUS | Status: DC | PRN
  Administered 2024-02-02: 20 ug/min via INTRAVENOUS

## 2024-02-02 MED ORDER — LIDOCAINE 2% (20 MG/ML) 5 ML SYRINGE
INTRAMUSCULAR | Status: DC | PRN
  Administered 2024-02-02: 100 mg via INTRAVENOUS

## 2024-02-02 MED ORDER — SUGAMMADEX SODIUM 200 MG/2ML IV SOLN
INTRAVENOUS | Status: DC | PRN
  Administered 2024-02-02: 200 mg via INTRAVENOUS

## 2024-02-02 MED ORDER — ATROPINE SULFATE 1 MG/ML IV SOLN
INTRAVENOUS | Status: DC | PRN
  Administered 2024-02-02: 1 mg via INTRAVENOUS

## 2024-02-02 MED ORDER — ACETAMINOPHEN 10 MG/ML IV SOLN: 1000.0000 mg | Freq: Once | INTRAVENOUS | Status: DC | PRN

## 2024-02-02 MED ORDER — PROPOFOL 10 MG/ML IV BOLUS
INTRAVENOUS | Status: DC | PRN
  Administered 2024-02-02: 160 mg via INTRAVENOUS

## 2024-02-02 MED ORDER — ROCURONIUM BROMIDE 10 MG/ML (PF) SYRINGE
PREFILLED_SYRINGE | INTRAVENOUS | Status: DC | PRN
  Administered 2024-02-02: 10 mg via INTRAVENOUS
  Administered 2024-02-02: 60 mg via INTRAVENOUS

## 2024-02-02 MED ORDER — ONDANSETRON HCL 4 MG/2ML IJ SOLN
INTRAMUSCULAR | Status: DC | PRN
  Administered 2024-02-02: 4 mg via INTRAVENOUS

## 2024-02-02 MED ORDER — ONDANSETRON HCL 4 MG/2ML IJ SOLN: 4.0000 mg | Freq: Once | INTRAMUSCULAR | Status: DC | PRN

## 2024-02-02 MED ORDER — OXYCODONE HCL 5 MG PO TABS: 5.0000 mg | ORAL_TABLET | Freq: Once | ORAL | Status: DC | PRN

## 2024-02-02 MED ORDER — ATROPINE SULFATE 1 MG/10ML IJ SOSY
PREFILLED_SYRINGE | INTRAMUSCULAR | Status: AC
  Filled 2024-02-02: qty 10

## 2024-02-02 MED ORDER — FENTANYL CITRATE (PF) 100 MCG/2ML IJ SOLN
INTRAMUSCULAR | Status: AC
  Filled 2024-02-02: qty 2

## 2024-02-02 MED ORDER — DEXAMETHASONE SODIUM PHOSPHATE 10 MG/ML IJ SOLN
INTRAMUSCULAR | Status: DC | PRN
  Administered 2024-02-02: 10 mg via INTRAVENOUS

## 2024-02-02 MED ORDER — FENTANYL CITRATE (PF) 100 MCG/2ML IJ SOLN: 25.0000 ug | INTRAMUSCULAR | Status: DC | PRN

## 2024-02-02 MED ORDER — FENTANYL CITRATE (PF) 250 MCG/5ML IJ SOLN
INTRAMUSCULAR | Status: DC | PRN
  Administered 2024-02-02 (×2): 50 ug via INTRAVENOUS

## 2024-02-02 MED ORDER — ACETAMINOPHEN 325 MG PO TABS: 650.0000 mg | ORAL_TABLET | ORAL | Status: DC | PRN

## 2024-02-02 MED ORDER — OXYCODONE HCL 5 MG/5ML PO SOLN
5.0000 mg | Freq: Once | ORAL | Status: DC | PRN
  Filled 2024-02-02: qty 5

## 2024-02-02 MED ORDER — PHENYLEPHRINE 80 MCG/ML (10ML) SYRINGE FOR IV PUSH (FOR BLOOD PRESSURE SUPPORT)
PREFILLED_SYRINGE | INTRAVENOUS | Status: DC | PRN
  Administered 2024-02-02: 120 ug via INTRAVENOUS

## 2024-02-02 MED ORDER — SODIUM CHLORIDE 0.9 % IV SOLN
INTRAVENOUS | Status: DC
Start: 2024-02-02 — End: 2024-02-02

## 2024-02-02 MED ORDER — HEPARIN SODIUM (PORCINE) 1000 UNIT/ML IJ SOLN
INTRAMUSCULAR | Status: DC | PRN
Start: 2024-02-02 — End: 2024-02-02
  Administered 2024-02-02: 2000 [IU] via INTRAVENOUS
  Administered 2024-02-02: 14000 [IU] via INTRAVENOUS

## 2024-02-02 MED ORDER — PROTAMINE SULFATE 10 MG/ML IV SOLN
INTRAVENOUS | Status: DC | PRN
  Administered 2024-02-02: 30 mg via INTRAVENOUS
  Administered 2024-02-02: 10 mg via INTRAVENOUS

## 2024-02-02 MED ORDER — HEPARIN (PORCINE) IN NACL 1000-0.9 UT/500ML-% IV SOLN
INTRAVENOUS | Status: DC | PRN
  Administered 2024-02-02 (×3): 500 mL

## 2024-02-02 SURGICAL SUPPLY — 19 items
CABLE FARASTAR GEN2 SNGL USE (CABLE) IMPLANT
CATH EZ STEER NAV 8MM D-F CUR (ABLATOR) IMPLANT
CATH FARAWAVE 2.0 31 (CATHETERS) IMPLANT
CATH GE 8FR SOUNDSTAR (CATHETERS) IMPLANT
CATH OCTARAY 2.0 F 3-3-3-3-3 (CATHETERS) IMPLANT
CATH WEB BI DIR CSDF CRV REPRO (CATHETERS) IMPLANT
CLOSURE MYNX CONTROL 6F/7F (Vascular Products) IMPLANT
CLOSURE PERCLOSE PROSTYLE (VASCULAR PRODUCTS) IMPLANT
COVER SWIFTLINK CONNECTOR (BAG) ×1 IMPLANT
DILATOR VESSEL 38 20CM 16FR (INTRODUCER) IMPLANT
GUIDEWIRE INQWIRE 1.5J.035X260 (WIRE) IMPLANT
KIT VERSACROSS CNCT FARADRIVE (KITS) IMPLANT
PACK EP LF (CUSTOM PROCEDURE TRAY) ×1 IMPLANT
PAD DEFIB RADIO PHYSIO CONN (PAD) ×1 IMPLANT
PATCH CARTO3 (PAD) IMPLANT
SHEATH FARADRIVE STEERABLE (SHEATH) IMPLANT
SHEATH PINNACLE 8F 10CM (SHEATH) IMPLANT
SHEATH PINNACLE 9F 10CM (SHEATH) IMPLANT
SHEATH PROBE COVER 6X72 (BAG) IMPLANT

## 2024-02-02 NOTE — H&P (Signed)
  Electrophysiology Office Note:   Date:  02/02/2024  ID:  Frank Hunt, DOB 1956-01-05, MRN [id number]  Primary Cardiologist: Ozell Fell, MD Primary Heart Failure: None Electrophysiologist: Anthea Udovich Gladis Norton, MD      History of Present Illness:   Frank Hunt is a 68 y.o. male with h/o atrial fibrillation, hypertension, hyperlipidemia seen today for routine electrophysiology followup.   He is post ablation for atrial fibrillation.  Unfortunately he has now gone into atrial flutter.  He had a cardioversion but went back into atrial flutter.  Is in sinus rhythm today.  He is feeling well.  When he was in atrial flutter, he had symptoms of fatigue and mild shortness of breath.  Today, denies symptoms of palpitations, chest pain, dyspnea, orthopnea, PND, lower extremity edema, claudication, dizziness, presyncope, syncope, bleeding, or neurologic sequela. The patient is tolerating medications without difficulties. Plan ablation today.   EP Information / Studies Reviewed:    EKG is ordered today. Personal review as below.       Risk Assessment/Calculations:    CHA2DS2-VASc Score = 3   This indicates a 3.2% annual risk of stroke. The patient's score is based upon: CHF History: 0 HTN History: 1 Diabetes History: 0 Stroke History: 0 Vascular Disease History: 1 Age Score: 1 Gender Score: 0             Physical Exam:   VS:  BP 131/71   Pulse 66   Temp 97.8 F (36.6 C) (Oral)   Resp 18   Ht 5' 11 (1.803 m)   Wt 81.6 kg   SpO2 98%   BMI 25.10 kg/m    Wt Readings from Last 3 Encounters:  02/02/24 81.6 kg  12/29/23 81.2 kg  11/02/23 81.2 kg    GEN: No acute distress.   Neck: No JVD Cardiac: RRR, no murmurs, rubs, or gallops.  Respiratory: normal BS bilaterally. GI: Soft, nontender, non-distended  MS: No edema; No deformity. Neuro:  Nonfocal  Skin: warm and dry Psych: Normal affect    ASSESSMENT AND PLAN:    1.  Persistent atrial fibrillation: Frank Hunt has presented today for surgery, with the diagnosis of AF.  The various methods of treatment have been discussed with the patient and family. After consideration of risks, benefits and other options for treatment, the patient has consented to  Procedure(s): Catheter ablation as a surgical intervention .  Risks include but not limited to complete heart block, stroke, esophageal damage, nerve damage, bleeding, vascular damage, tamponade, perforation, MI, and death. The patient's history has been reviewed, patient examined, no change in status, stable for surgery.  I have reviewed the patient's chart and labs.  Questions were answered to the patient's satisfaction.    Nhung Danko Norton, MD 02/02/2024 7:12 AM

## 2024-02-02 NOTE — Anesthesia Procedure Notes (Signed)
 Procedure Name: Intubation Date/Time: 02/02/2024 7:37 AM  Performed by: Virgil Ee, CRNAPre-anesthesia Checklist: Patient identified, Patient being monitored, Timeout performed, Emergency Drugs available and Suction available Patient Re-evaluated:Patient Re-evaluated prior to induction Oxygen Delivery Method: Circle system utilized Preoxygenation: Pre-oxygenation with 100% oxygen Induction Type: IV induction Ventilation: Mask ventilation without difficulty Laryngoscope Size: Mac and 4 Grade View: Grade II Tube type: Oral Tube size: 7.5 mm Number of attempts: 1 Airway Equipment and Method: Stylet Placement Confirmation: ETT inserted through vocal cords under direct vision, positive ETCO2 and breath sounds checked- equal and bilateral Secured at: 23 cm Tube secured with: Tape Dental Injury: Teeth and Oropharynx as per pre-operative assessment

## 2024-02-02 NOTE — Anesthesia Postprocedure Evaluation (Signed)
 Anesthesia Post Note  Patient: Frank Hunt  Procedure(s) Performed: ATRIAL FIBRILLATION ABLATION     Patient location during evaluation: PACU Anesthesia Type: General Level of consciousness: awake and alert Pain management: pain level controlled Vital Signs Assessment: post-procedure vital signs reviewed and stable Respiratory status: spontaneous breathing, nonlabored ventilation, respiratory function stable and patient connected to nasal cannula oxygen Cardiovascular status: blood pressure returned to baseline and stable Postop Assessment: no apparent nausea or vomiting Anesthetic complications: no   There were no known notable events for this encounter.  Last Vitals:  Vitals:   02/02/24 0935 02/02/24 0940  BP: 105/64 105/66  Pulse:    Resp:    Temp:    SpO2:      Last Pain:  Vitals:   02/02/24 0910  TempSrc: Oral  PainSc: 0-No pain                 Rome Ade

## 2024-02-02 NOTE — Progress Notes (Signed)
 Up and walked and tolerated well; bilat groins stable, no bleeding or hematoma

## 2024-02-02 NOTE — Transfer of Care (Signed)
 Immediate Anesthesia Transfer of Care Note  Patient: Frank Hunt  Procedure(s) Performed: ATRIAL FIBRILLATION ABLATION  Patient Location: PACU and Cath Lab  Anesthesia Type:General  Level of Consciousness: awake  Airway & Oxygen Therapy: Patient Spontanous Breathing  Post-op Assessment: Report given to RN and Post -op Vital signs reviewed and stable  Post vital signs: Reviewed and stable  Last Vitals:  Vitals Value Taken Time  BP 98/61 02/02/24 09:15  Temp    Pulse 66 02/02/24 09:15  Resp 13 02/02/24 09:15  SpO2 96 % 02/02/24 09:15    Last Pain:  Vitals:   02/02/24 0910  TempSrc: Oral  PainSc:          Complications: There were no known notable events for this encounter.

## 2024-02-02 NOTE — Discharge Instructions (Signed)

## 2024-02-03 ENCOUNTER — Telehealth (HOSPITAL_COMMUNITY): Payer: Self-pay

## 2024-02-03 NOTE — Telephone Encounter (Signed)
 Spoke with patient to complete post procedure follow up call.  Patient reports no complications with groin sites.   Instructions reviewed with patient:  Remove large bandage at puncture site after 24 hours. It is normal to have bruising, tenderness, mild swelling, and a pea or marble sized lump/knot at the groin site which can take up to three months to resolve.  Get help right away if you notice sudden swelling at the puncture site.  Check your puncture site every day for signs of infection: fever, redness, swelling, pus drainage, warmth, foul odor or excessive pain. If this occurs, please call the office at [phone], to speak with the nurse. Get help right away if your puncture site is bleeding and the bleeding does not stop after applying firm pressure to the area.  You may continue to have skipped beats/ atrial fibrillation during the first several months after your procedure.  It is very important not to miss any doses of your blood thinner Eliquis .   You will follow up with the Afib clinic on 03/02/24 and follow up with the Afib clinic on 05/04/24.    Patient verbalized understanding to all instructions provided.

## 2024-02-08 MED FILL — Atropine Sulfate Soln Prefill Syr 1 MG/10ML (0.1 MG/ML): INTRAMUSCULAR | Qty: 10 | Status: CN

## 2024-02-08 MED FILL — Atropine Sulfate Soln Prefill Syr 1 MG/10ML (0.1 MG/ML): INTRAMUSCULAR | Qty: 10 | Status: AC

## 2024-02-08 MED FILL — Fentanyl Citrate Preservative Free (PF) Inj 100 MCG/2ML: INTRAMUSCULAR | Qty: 2 | Status: CN

## 2024-02-08 MED FILL — Fentanyl Citrate Preservative Free (PF) Inj 100 MCG/2ML: INTRAMUSCULAR | Qty: 2 | Status: AC

## 2024-02-15 ENCOUNTER — Ambulatory Visit (INDEPENDENT_AMBULATORY_CARE_PROVIDER_SITE_OTHER)

## 2024-02-15 ENCOUNTER — Encounter: Payer: Self-pay | Admitting: Emergency Medicine

## 2024-02-15 ENCOUNTER — Telehealth: Payer: Self-pay

## 2024-02-15 VITALS — BP 137/75 | HR 65 | Ht 71.0 in | Wt 180.0 lb

## 2024-02-15 DIAGNOSIS — Z0000 Encounter for general adult medical examination without abnormal findings: Secondary | ICD-10-CM

## 2024-02-15 NOTE — Telephone Encounter (Signed)
 Patient states that the current dose of Tadalafil  5mg  daily does not seem to work as well as the higher dose that he was once on and wanted to know if you would increase that dose for him.  He also wanted to ask about the his Bp.  Patient explains that his Bp seems to be higher now then before the procedure, (atrial fibrillation ablation) averaging in 130's/mid 70's first one of the day.  Patient is taking his Bp 3 times a day.  His question is does he still need to take all Bp medications as is /or a higher or lower dose?  I informed pt that I would send Dr. Garald the message and we will let him know what is advised.

## 2024-02-15 NOTE — Progress Notes (Addendum)
 Subjective:   Frank Hunt is a 68 y.o. who presents for a Medicare Wellness preventive visit.  As a reminder, Annual Wellness Visits don't include a physical exam, and some assessments may be limited, especially if this visit is performed virtually. We may recommend an in-person follow-up visit with your provider if needed.  Visit Complete: Virtual I connected with  Frank Hunt on 02/15/24 by a audio enabled telemedicine application and verified that I am speaking with the correct person using two identifiers.  Patient Location: Home  Provider Location: Home Office  I discussed the limitations of evaluation and management by telemedicine. The patient expressed understanding and agreed to proceed.  Vital Signs: Because this visit was a virtual/telehealth visit, some criteria may be missing or patient reported. Any vitals not documented were not able to be obtained and vitals that have been documented are patient reported.  VideoDeclined- This patient declined Librarian, academic. Therefore the visit was completed with audio only.  Persons Participating in Visit: Patient.  AWV Questionnaire: Yes: Patient Medicare AWV questionnaire was completed by the patient on 02/11/2024; I have confirmed that all information answered by patient is correct and no changes since this date.  Cardiac Risk Factors include: advanced age (>51men, >54 women);male gender;hypertension;Other (see comment);dyslipidemia, Risk factor comments: PAF, BPH,   Coronary atherosclerosis     Objective:    Today's Vitals   02/11/24 1912 02/15/24 1025  Weight:  180 lb (81.6 kg)  Height:  5' 11 (1.803 m)  PainSc: 0-No pain    Body mass index is 25.1 kg/m.     02/15/2024   10:49 AM 02/02/2024    6:23 AM 09/22/2023   11:36 AM 02/23/2022    9:24 AM 12/01/2021    8:20 PM 10/27/2021    3:40 PM 10/05/2021    9:29 AM  Advanced Directives  Does Patient Have a Medical Advance Directive? No No  No No No No No  Would patient like information on creating a medical advance directive?  No - Patient declined  No - Patient declined No - Patient declined Yes (MAU/Ambulatory/Procedural Areas - Information given) No - Patient declined    Current Medications (verified) Outpatient Encounter Medications as of 02/15/2024  Medication Sig   apixaban  (ELIQUIS ) 5 MG TABS tablet Take  1 tablet  2 x /day (every 12 hours) to prevent Blood Clots                                       /                         TAKE      BYMOUTH   bisoprolol  (ZEBETA ) 5 MG tablet TAKE 1 TABLET BY MOUTH DAILY FOR BLOOD PRESSURE AND HEART RATE/RHYTHM   Cholecalciferol (VITAMIN D3) 250 MCG (10000 UT) capsule Take 10,000 Units by mouth in the morning.   diltiazem  (CARDIZEM ) 30 MG tablet Cardizem  30mg  -- Take 1 tablet every 4 hours AS NEEDED for AFIB heart rate >100 as long as top BP >100.   diltiazem  (TIAZAC ) 180 MG 24 hr capsule TAKE 1 CAPSULE BY MOUTH DAILY FOR BLOOD PRESSURE AND HEART RHYTHM (Patient taking differently: Take 180 mg by mouth every evening.)   Magnesium Oxide (MAG-200 PO) Take 400 mg by mouth in the morning.   Menaquinone-7 (VITAMIN K2 PO) Take 1 tablet  by mouth in the morning.   rosuvastatin  (CRESTOR ) 20 MG tablet Take 1 tablet Daily for Cholesterol (Patient taking differently: Take 10 mg by mouth every other day. In the evening.)   tadalafil  (CIALIS ) 5 MG tablet Take 1 tablet (5 mg total) by mouth daily.   No facility-administered encounter medications on file as of 02/15/2024.    Allergies (verified) Patient has no known allergies.   History: Past Medical History:  Diagnosis Date   Family history of premature CAD    Hyperlipidemia    Hypertension    PAF (paroxysmal atrial fibrillation) (HCC)    Prediabetes    Past Surgical History:  Procedure Laterality Date   ATRIAL FIBRILLATION ABLATION N/A 02/23/2022   Procedure: ATRIAL FIBRILLATION ABLATION;  Surgeon: Inocencio Soyla Lunger, MD;  Location: [facility]  INVASIVE CV LAB;  Service: Cardiovascular;  Laterality: N/A;   ATRIAL FIBRILLATION ABLATION N/A 02/02/2024   Procedure: ATRIAL FIBRILLATION ABLATION;  Surgeon: Inocencio Soyla Lunger, MD;  Location: [facility] INVASIVE CV LAB;  Service: Cardiovascular;  Laterality: N/A;   CARDIOVERSION N/A 10/05/2021   Procedure: CARDIOVERSION;  Surgeon: Kate Lonni CROME, MD;  Location: [facility] ENDOSCOPY;  Service: Cardiovascular;  Laterality: N/A;   CARDIOVERSION N/A 09/22/2023   Procedure: CARDIOVERSION;  Surgeon: Jeffrie Oneil BROCKS, MD;  Location: [facility] INVASIVE CV LAB;  Service: Cardiovascular;  Laterality: N/A;   CATARACT EXTRACTION W/ INTRAOCULAR LENS IMPLANT Left 03/26/2013   Dr. Cleatus   Family History  Problem Relation Age of Onset   Cancer Father    Heart attack Brother    Social History   Socioeconomic History   Marital status: Married    Spouse name: Santana   Number of children: 2   Years of education: Not on file   Highest education level: 12th grade  Occupational History   Occupation: RETIRED  Tobacco Use   Smoking status: Never   Smokeless tobacco: Never   Tobacco comments:    Never smoked 09/21/23  Vaping Use   Vaping status: Never Used  Substance and Sexual Activity   Alcohol use: No   Drug use: Never   Sexual activity: Not on file  Other Topics Concern   Not on file  Social History Narrative   Lives with wife/2025   Social Drivers of Health   Financial Resource Strain: Low Risk  (02/15/2024)   Overall Financial Resource Strain (CARDIA)    Difficulty of Paying Living Expenses: Not hard at all  Food Insecurity: No Food Insecurity (02/15/2024)   Hunger Vital Sign    Worried About Running Out of Food in the Last Year: Never true    Ran Out of Food in the Last Year: Never true  Transportation Needs: No Transportation Needs (02/15/2024)   PRAPARE - Administrator, Civil Service (Medical): No    Lack of Transportation (Non-Medical): No  Physical Activity: Sufficiently Active  (02/15/2024)   Exercise Vital Sign    Days of Exercise per Week: 5 days    Minutes of Exercise per Session: 60 min  Stress: No Stress Concern Present (02/15/2024)   [employer] of Occupational Health - Occupational Stress Questionnaire    Feeling of Stress: Not at all  Social Connections: Moderately Integrated (02/15/2024)   Social Connection and Isolation Panel    Frequency of Communication with Friends and Family: Three times a week    Frequency of Social Gatherings with Friends and Family: Twice a week    Attends Religious Services: More than 4 times per year  Active Member of Clubs or Organizations: No    Attends Banker Meetings: Not on file    Marital Status: Married    Tobacco Counseling Counseling given: Not Answered Tobacco comments: Never smoked 09/21/23    Clinical Intake:     Pain : No/denies pain Pain Score: 0-No pain     BMI - recorded: 25.1 Nutritional Status: BMI 25 -29 Overweight Nutritional Risks: None Diabetes: No  Lab Results  Component Value Date   HGBA1C 5.8 01/02/2024   HGBA1C 5.6 04/20/2023   HGBA1C 5.9 (H) 08/18/2021     How often do you need to have someone help you when you read instructions, pamphlets, or other written materials from your doctor or pharmacy?: 1 - Never  Interpreter Needed?: No  Information entered by :: Lolah Coghlan, RMA   Activities of Daily Living     02/11/2024    7:12 PM 09/22/2023   11:35 AM  In your present state of health, do you have any difficulty performing the following activities:  Hearing? 0 0  Vision? 0 0  Difficulty concentrating or making decisions? 0 0  Walking or climbing stairs? 0   Dressing or bathing? 0   Doing errands, shopping? 0   Preparing Food and eating ? N   Using the Toilet? N   In the past six months, have you accidently leaked urine? N   Do you have problems with loss of bowel control? N   Managing your Medications? N   Managing your Finances? N    Housekeeping or managing your Housekeeping? N     Patient Care Team: Plotnikov, Karlynn GAILS, MD as PCP - General (Internal Medicine) Inocencio Soyla Lunger, MD as PCP - Electrophysiology (Cardiology) Wonda Sharper, MD as PCP - Cardiology (Cardiology) Tonita Fallow, MD as Referring Physician (Internal Medicine) Wonda Sharper, MD as Consulting Physician (Cardiology)  I have updated your Care Teams any recent Medical Services you may have received from other providers in the past year.     Assessment:   This is a routine wellness examination for Ewald.  Hearing/Vision screen Hearing Screening - Comments:: Denies hearing difficulties   Vision Screening - Comments:: Wears eyeglasses/Gould   Goals Addressed   None    Depression Screen     02/15/2024   10:50 AM 12/29/2023    9:13 AM 10/27/2021    3:40 PM 01/30/2021    4:58 PM  PHQ 2/9 Scores  PHQ - 2 Score 0 0 0 0  PHQ- 9 Score 0       Fall Risk     02/11/2024    7:12 PM 12/29/2023    9:12 AM 10/27/2021    3:40 PM 01/30/2021    4:58 PM 02/26/2019    5:21 PM  Fall Risk   Falls in the past year? 0 0 0 0 --   Comment     Emmi Telephone Survey: data to providers prior to load   Number falls in past yr:  0 0  --   Comment     Emmi Telephone Survey Actual Response =    Injury with Fall?  0 0    Risk for fall due to :  No Fall Risks No Fall Risks No Fall Risks   Follow up Falls evaluation completed;Falls prevention discussed Falls evaluation completed Falls evaluation completed;Falls prevention discussed  Falls evaluation completed;Education provided;Falls prevention discussed       Data saved with a previous flowsheet row definition    MEDICARE  RISK AT HOME:  Medicare Risk at Home Any stairs in or around the home?: (Patient-Rptd) Yes If so, are there any without handrails?: (Patient-Rptd) No Home free of loose throw rugs in walkways, pet beds, electrical cords, etc?: (Patient-Rptd) Yes Adequate lighting in your home to reduce  risk of falls?: (Patient-Rptd) Yes Life alert?: (Patient-Rptd) No Use of a cane, walker or w/c?: (Patient-Rptd) No Grab bars in the bathroom?: (Patient-Rptd) No Shower chair or bench in shower?: (Patient-Rptd) No Elevated toilet seat or a handicapped toilet?: (Patient-Rptd) Yes  TIMED UP AND GO:  Was the test performed?  No  Cognitive Function: Declined/Normal: No cognitive concerns noted by patient or family. Patient alert, oriented, able to answer questions appropriately and recall recent events. No signs of memory loss or confusion.        Immunizations Immunization History  Administered Date(s) Administered   Influenza, High Dose Seasonal PF 10/16/2018   PFIZER(Purple Top)SARS-COV-2 Vaccination 09/16/2019, 10/10/2019   PPD Test 03/13/2014    Screening Tests Health Maintenance  Topic Date Due   DTaP/Tdap/Td (1 - Tdap) Never done   Pneumococcal Vaccine: 50+ Years (1 of 2 - PCV) Never done   Zoster Vaccines- Shingrix (1 of 2) Never done   Colonoscopy  10/23/2019   COVID-19 Vaccine (3 - Pfizer risk series) 11/07/2019   INFLUENZA VACCINE  02/24/2024   Medicare Annual Wellness (AWV)  02/14/2025   Hepatitis C Screening  Completed   Hepatitis B Vaccines  Aged Out   HPV VACCINES  Aged Out   Meningococcal B Vaccine  Aged Out    Health Maintenance  Health Maintenance Due  Topic Date Due   DTaP/Tdap/Td (1 - Tdap) Never done   Pneumococcal Vaccine: 50+ Years (1 of 2 - PCV) Never done   Zoster Vaccines- Shingrix (1 of 2) Never done   Colonoscopy  10/23/2019   COVID-19 Vaccine (3 - Pfizer risk series) 11/07/2019   Health Maintenance Items Addressed: See Nurse Notes at the end of this note  Additional Screening:  Vision Screening: Recommended annual ophthalmology exams for early detection of glaucoma and other disorders of the eye. Would you like a referral to an eye doctor? No    Dental Screening: Recommended annual dental exams for proper oral hygiene  Community  Resource Referral / Chronic Care Management: CRR required this visit?  No   CCM required this visit?  No   Plan:    I have personally reviewed and noted the following in the patient's chart:   Medical and social history Use of alcohol, tobacco or illicit drugs  Current medications and supplements including opioid prescriptions. Patient is not currently taking opioid prescriptions. Functional ability and status Nutritional status Physical activity Advanced directives List of other physicians Hospitalizations, surgeries, and ER visits in previous 12 months Vitals Screenings to include cognitive, depression, and falls Referrals and appointments  In addition, I have reviewed and discussed with patient certain preventive protocols, quality metrics, and best practice recommendations. A written personalized care plan for preventive services as well as general preventive health recommendations were provided to patient.   Larry Alcock L Tomma Ehinger, CMA   02/15/2024   After Visit Summary: (MyChart) Due to this being a telephonic visit, the after visit summary with patients personalized plan was offered to patient via MyChart   Notes: Patient declines all vaccines at this time.  He is due for a coloscopy but discussed the Cologuard with provider.  It has been ordered, however patient was told by insurance that it  was not quit time to check colon.  Patient waiting to hear from them of when he needs to send kit back.  I have sent a message to Dr. Garald in regard to some questions had today during this visit.   Medical screening examination/treatment/procedure(s) were performed by non-physician practitioner and as supervising physician I was immediately available for consultation/collaboration.  I agree with above. Karlynn Garald, MD

## 2024-02-15 NOTE — Patient Instructions (Signed)
 Mr. Frank Hunt , Thank you for taking time out of your busy schedule to complete your Annual Wellness Visit with me. I enjoyed our conversation and look forward to speaking with you again next year. I, as well as your care team,  appreciate your ongoing commitment to your health goals. Please review the following plan we discussed and let me know if I can assist you in the future. Your Game plan/ To Do List    Follow up Visits: Next Medicare AWV with our clinical staff: 02/18/2025.   Have you seen your provider in the last 6 months (3 months if uncontrolled diabetes)? Yes Next Office Visit with your provider: 07/02/2024.  Clinician Recommendations:  Aim for 30 minutes of exercise or brisk walking, 6-8 glasses of water, and 5 servings of fruits and vegetables each day. Keep up the good work.      This is a list of the screening recommended for you and due dates:  Health Maintenance  Topic Date Due   DTaP/Tdap/Td vaccine (1 - Tdap) Never done   Pneumococcal Vaccine for age over 45 (1 of 2 - PCV) Never done   Zoster (Shingles) Vaccine (1 of 2) Never done   Colon Cancer Screening  10/23/2019   COVID-19 Vaccine (3 - Pfizer risk series) 11/07/2019   Flu Shot  02/24/2024   Medicare Annual Wellness Visit  02/14/2025   Hepatitis C Screening  Completed   Hepatitis B Vaccine  Aged Out   HPV Vaccine  Aged Out   Meningitis B Vaccine  Aged Out    Advanced directives: (Declined) Advance directive discussed with you today. Even though you declined this today, please call our office should you change your mind, and we can give you the proper paperwork for you to fill out. Advance Care Planning is important because it:  [x]  Makes sure you receive the medical care that is consistent with your values, goals, and preferences  [x]  It provides guidance to your family and loved ones and reduces their decisional burden about whether or not they are making the right decisions based on your wishes.  Follow the  link provided in your after visit summary or read over the paperwork we have mailed to you to help you started getting your Advance Directives in place. If you need assistance in completing these, please reach out to us  so that we can help you!  See attachments for Preventive Care and Fall Prevention Tips.

## 2024-02-16 NOTE — Telephone Encounter (Signed)
 Please schedule office visit to see me.  Check blood pressure twice a day.  Bring blood pressure records to the appointment. Thank you

## 2024-02-17 NOTE — Telephone Encounter (Signed)
 Copied from CRM #[id number]. Topic: General - Other >> Feb 17, 2024 12:49 PM Carlyon D wrote: Reason for CRM: Pt would like Brendelle to give him a call back in regards to a medical question he has.

## 2024-03-02 ENCOUNTER — Ambulatory Visit (HOSPITAL_COMMUNITY)
Admission: RE | Admit: 2024-03-02 | Discharge: 2024-03-02 | Disposition: A | Discharge status: Home / Self Care | Source: Ambulatory Visit | Attending: Physician Assistant | Admitting: Physician Assistant

## 2024-03-02 ENCOUNTER — Encounter (HOSPITAL_COMMUNITY): Payer: Self-pay | Admitting: Physician Assistant

## 2024-03-02 VITALS — BP 134/74 | HR 65 | Ht 71.0 in | Wt 188.6 lb

## 2024-03-02 DIAGNOSIS — D6869 Other thrombophilia: Secondary | ICD-10-CM | POA: Diagnosis not present

## 2024-03-02 DIAGNOSIS — I4819 Other persistent atrial fibrillation: Secondary | ICD-10-CM

## 2024-03-02 NOTE — Progress Notes (Signed)
 Primary Care Physician: Garald Karlynn GAILS, MD Primary Cardiologist: Ozell Fell, MD Electrophysiologist: Will Gladis Norton, MD  Referring Physician: Dr Norton Garnette KATHEE Janet is a 68 y.o. male with a history of HLD, HTN, CAD, atrial flutter, atrial fibrillation who presents for follow up in the [facility].  The patient was initially diagnosed with atrial fibrillation 07/2021. He had DCCV 09/2021 which was unsuccessful. He was seen by Dr Norton and underwent afib ablation on 02/23/22. Patient is on Eliquis  for stroke prevention. He was later found to have typical atrial flutter. He underwent DCCV on 09/22/23 and then repeat afib and flutter ablation with Dr Norton on 02/02/24.  Patient returns for follow up for atrial fibrillation. He reports that he has done well since his ablation with no interim symptoms of afib. He denies chest pain or groin issues. No bleeding issues on anticoagulation.   Today, he  denies symptoms of palpitations, chest pain, shortness of breath, orthopnea, PND, lower extremity edema, dizziness, presyncope, syncope, snoring, daytime somnolence, bleeding, or neurologic sequela. The patient is tolerating medications without difficulties and is otherwise without complaint today.    Atrial Fibrillation Risk Factors:  he does not have symptoms or diagnosis of sleep apnea. Negative sleep study 2023. he does not have a history of rheumatic fever.   Atrial Fibrillation Management history:  Previous antiarrhythmic drugs: none Previous cardioversions: 09/2021, 09/22/23 Previous ablations: 02/23/22, 02/02/24 Anticoagulation history: Eliquis   ROS- All systems are reviewed and negative except as per the HPI above.  Past Medical History:  Diagnosis Date   Family history of premature CAD    Hyperlipidemia    Hypertension    PAF (paroxysmal atrial fibrillation) (HCC)    Prediabetes     Current Outpatient Medications  Medication Sig  Dispense Refill   apixaban  (ELIQUIS ) 5 MG TABS tablet Take  1 tablet  2 x /day (every 12 hours) to prevent Blood Clots                                       /                         TAKE      BYMOUTH 180 tablet 3   bisoprolol  (ZEBETA ) 5 MG tablet TAKE 1 TABLET BY MOUTH DAILY FOR BLOOD PRESSURE AND HEART RATE/RHYTHM 90 tablet 3   Cholecalciferol (VITAMIN D3) 250 MCG (10000 UT) capsule Take 10,000 Units by mouth in the morning.     diltiazem  (CARDIZEM ) 30 MG tablet Cardizem  30mg  -- Take 1 tablet every 4 hours AS NEEDED for AFIB heart rate >100 as long as top BP >100. 30 tablet 1   diltiazem  (TIAZAC ) 180 MG 24 hr capsule TAKE 1 CAPSULE BY MOUTH DAILY FOR BLOOD PRESSURE AND HEART RHYTHM 90 capsule 3   Magnesium Oxide (MAG-200 PO) Take 400 mg by mouth in the morning.     Menaquinone-7 (VITAMIN K2 PO) Take 1 tablet by mouth in the morning.     rosuvastatin  (CRESTOR ) 20 MG tablet Take 1 tablet Daily for Cholesterol (Patient taking differently: Take 10 mg by mouth every other day. Take 1 tablet Daily for Cholesterol) 90 tablet 3   tadalafil  (CIALIS ) 5 MG tablet Take 1 tablet (5 mg total) by mouth daily. 30 tablet 11   No current facility-administered medications for this encounter.  Physical Exam: BP 134/74   Pulse 65   Ht 5' 11 (1.803 m)   Wt 85.5 kg   BMI 26.30 kg/m   GEN: Well nourished, well developed in no acute distress CARDIAC: Regular rate and rhythm, no murmurs, rubs, gallops RESPIRATORY:  Clear to auscultation without rales, wheezing or rhonchi  ABDOMEN: Soft, non-tender, non-distended EXTREMITIES:  No edema; No deformity    Wt Readings from Last 3 Encounters:  03/02/24 85.5 kg  02/15/24 81.6 kg  02/02/24 81.6 kg     ECG today demonstrates SR, 1st degree AV block, LAFB Vent. rate 65 BPM PR interval 210 ms QRS duration 106 ms QT/QTcB 374/388 ms   Echo 11/10/21 demonstrated   1. Left ventricular ejection fraction, by estimation, is 50 to 55%. The  left ventricle has  low normal function. The left ventricle has no regional  wall motion abnormalities. Left ventricular diastolic function could not  be evaluated.   2. Right ventricular systolic function is normal. The right ventricular  size is normal. There is normal pulmonary artery systolic pressure.   3. Left atrial size was mildly dilated.   4. Right atrial size was mildly dilated.   5. The mitral valve is normal in structure. Trivial mitral valve  regurgitation. No evidence of mitral stenosis.   6. The aortic valve is tricuspid. Aortic valve regurgitation is trivial.  Aortic valve sclerosis/calcification is present, without any evidence of  aortic stenosis.   7. Aortic dilatation noted. There is borderline dilatation of the  ascending aorta, measuring 38 mm.   8. The inferior vena cava is normal in size with greater than 50%  respiratory variability, suggesting right atrial pressure of 3 mmHg.   Comparison(s): No prior Echocardiogram.     CHA2DS2-VASc Score = 3  The patient's score is based upon: CHF History: 0 HTN History: 1 Diabetes History: 0 Stroke History: 0 Vascular Disease History: 1 Age Score: 1 Gender Score: 0       ASSESSMENT AND PLAN: Persistent Atrial Fibrillation/typical atrial flutter (ICD10:  I48.19) The patient's CHA2DS2-VASc score is 3, indicating a 3.2% annual risk of stroke.   S/p afib ablation 02/23/22, repeat afib and flutter ablation 02/02/24 Patient appears to be maintaining SR Continue Eliquis  5 mg BID with no missed doses for 3 months post ablation.  Continue bisoprolol  5 mg daily Continue diltiazem  180 mg daily with 30 mg PRN q 4 hours for heart racing.  Secondary Hypercoagulable State (ICD10:  D68.69) The patient is at significant risk for stroke/thromboembolism based upon his CHA2DS2-VASc Score of 3.  Continue Apixaban  (Eliquis ). No bleeding issues.   HTN Stable on current regimen  CAD CAC score 902 No anginal symptoms Followed by Dr Wonda   Follow  up in the AF clinic in 2 months.     Daril Kicks PA-C Afib Clinic [facility] [address] [place], [place] [address] [phone]

## 2024-03-28 ENCOUNTER — Encounter: Payer: Self-pay | Admitting: Cardiovascular Disease

## 2024-03-28 ENCOUNTER — Ambulatory Visit: Attending: Cardiovascular Disease | Admitting: Cardiovascular Disease

## 2024-03-28 VITALS — BP 124/70 | HR 70 | Ht 71.0 in | Wt 188.8 lb

## 2024-03-28 DIAGNOSIS — I10 Essential (primary) hypertension: Secondary | ICD-10-CM | POA: Diagnosis not present

## 2024-03-28 DIAGNOSIS — I4819 Other persistent atrial fibrillation: Secondary | ICD-10-CM

## 2024-03-28 DIAGNOSIS — E782 Mixed hyperlipidemia: Secondary | ICD-10-CM

## 2024-03-28 MED ORDER — EZETIMIBE 10 MG PO TABS: 10.0000 mg | ORAL_TABLET | Freq: Every day | ORAL | 3 refills | Status: AC

## 2024-03-28 NOTE — Assessment & Plan Note (Signed)
 Patient's status post ablation.  Anticoagulated with apixaban .  Followed by EP.  Recommend an updated echocardiogram which will be ordered today.

## 2024-03-28 NOTE — Progress Notes (Signed)
 Cardiology Office Note:    Date:  03/28/2024   ID:  Frank Hunt, DOB 03-31-1956, MRN [id number]  PCP:  Garald Karlynn GAILS, MD   [facility] Providers Cardiologist:  Ozell Fell, MD Electrophysiologist:  Will Gladis Norton, MD     Referring MD: Garald Karlynn GAILS, MD   Chief Complaint  Patient presents with   Coronary Artery Disease    History of Present Illness:    Frank Hunt is a 68 y.o. male presenting for follow-up of hypertension, hyperlipidemia, and atrial fibrillation.  He has a strong family history of premature coronary artery disease.  Patient underwent atrial fibrillation ablation in 2023.  He is noted to have an elevated LP(a) at 136.  He recently had a cardiac CT scan and was noted to have a coronary calcium  score of 902, placing him in the 83rd percentile.  A stress test in 2023 showed no significant ischemia.  The patient is here alone today.  He is doing well.  He exercises regularly at the gym lifting weights and doing aerobic exercises with no exertional symptoms.  He specifically denies chest pain, chest pressure, or shortness of breath.  He is concerned about some changes that he appreciated on his recent CT scan which showed a big increase in his coronary calcium  score and also showed severe biatrial enlargement which was a new finding.  He otherwise expresses no concerns today.  He has had trouble tolerating rosuvastatin  due to some muscle aches.  He is taking that about every other day.   Current Medications: Current Meds  Medication Sig   apixaban  (ELIQUIS ) 5 MG TABS tablet Take  1 tablet  2 x /day (every 12 hours) to prevent Blood Clots                                       /                         TAKE      BYMOUTH   bisoprolol  (ZEBETA ) 5 MG tablet TAKE 1 TABLET BY MOUTH DAILY FOR BLOOD PRESSURE AND HEART RATE/RHYTHM   Cholecalciferol (VITAMIN D3) 250 MCG (10000 UT) capsule Take 10,000 Units by mouth in the morning.   diltiazem   (CARDIZEM ) 30 MG tablet Cardizem  30mg  -- Take 1 tablet every 4 hours AS NEEDED for AFIB heart rate >100 as long as top BP >100.   diltiazem  (TIAZAC ) 180 MG 24 hr capsule TAKE 1 CAPSULE BY MOUTH DAILY FOR BLOOD PRESSURE AND HEART RHYTHM   ezetimibe  (ZETIA ) 10 MG tablet Take 1 tablet (10 mg total) by mouth daily.   Magnesium Oxide (MAG-200 PO) Take 400 mg by mouth in the morning.   Menaquinone-7 (VITAMIN K2 PO) Take 1 tablet by mouth in the morning.   rosuvastatin  (CRESTOR ) 20 MG tablet Take 1 tablet Daily for Cholesterol   tadalafil  (CIALIS ) 5 MG tablet Take 1 tablet (5 mg total) by mouth daily.     Allergies:   Patient has no known allergies.   ROS:   Please see the history of present illness.    All other systems reviewed and are negative.  EKGs/Labs/Other Studies Reviewed:    The following studies were reviewed today: Cardiac Studies & Procedures   ______________________________________________________________________________________________   STRESS TESTS  MYOCARDIAL PERFUSION IMAGING 09/18/2021  Interpretation Summary   The study is intermediate  risk based upon reduction in ejection fraction, 41% EF   No ST deviation was noted.   Left ventricular function is abnormal. Nuclear stress EF: 41 %. The left ventricular ejection fraction is moderately decreased (30-44%). End diastolic cavity size is normal. End systolic cavity size is normal.   Small apical perfusion defect seen at both rest and stress.  No significant ischemia identified.   ECHOCARDIOGRAM  ECHOCARDIOGRAM COMPLETE 11/10/2021  Narrative ECHOCARDIOGRAM REPORT    Patient Name:   Frank Hunt Date of Exam: 11/10/2021 Medical Rec #:  [id number]     Height:       71.0 in Accession #:    [id number]    Weight:       180.4 lb Date of Birth:  [birth year]      BSA:          2.018 m Patient Age:    66 years      BP:           128/81 mmHg Patient Gender: M             HR:           58 bpm. Exam Location:  [address]  Procedure: 2D Echo, Cardiac Doppler and Color Doppler  Indications:    I48.91 Atrial Fibrillation  History:        Patient has no prior history of Echocardiogram examinations. Risk Factors:Family History of Coronary Artery Disease, Hypertension and HLD.  Sonographer:    Waldo Guadalajara RCS Referring Phys: [phone] Zamantha Strebel  IMPRESSIONS   1. Left ventricular ejection fraction, by estimation, is 50 to 55%. The left ventricle has low normal function. The left ventricle has no regional wall motion abnormalities. Left ventricular diastolic function could not be evaluated. 2. Right ventricular systolic function is normal. The right ventricular size is normal. There is normal pulmonary artery systolic pressure. 3. Left atrial size was mildly dilated. 4. Right atrial size was mildly dilated. 5. The mitral valve is normal in structure. Trivial mitral valve regurgitation. No evidence of mitral stenosis. 6. The aortic valve is tricuspid. Aortic valve regurgitation is trivial. Aortic valve sclerosis/calcification is present, without any evidence of aortic stenosis. 7. Aortic dilatation noted. There is borderline dilatation of the ascending aorta, measuring 38 mm. 8. The inferior vena cava is normal in size with greater than 50% respiratory variability, suggesting right atrial pressure of 3 mmHg.  Comparison(s): No prior Echocardiogram.  FINDINGS Left Ventricle: Left ventricular ejection fraction, by estimation, is 50 to 55%. The left ventricle has low normal function. The left ventricle has no regional wall motion abnormalities. The left ventricular internal cavity size was normal in size. There is no left ventricular hypertrophy. Left ventricular diastolic function could not be evaluated due to atrial fibrillation. Left ventricular diastolic function could not be evaluated.  Right Ventricle: The right ventricular size is normal. Right ventricular systolic function is normal. There is  normal pulmonary artery systolic pressure. The tricuspid regurgitant velocity is 2.03 m/s, and with an assumed right atrial pressure of 3 mmHg, the estimated right ventricular systolic pressure is 19.5 mmHg.  Left Atrium: Left atrial size was mildly dilated.  Right Atrium: Right atrial size was mildly dilated.  Pericardium: There is no evidence of pericardial effusion.  Mitral Valve: The mitral valve is normal in structure. Trivial mitral valve regurgitation. No evidence of mitral valve stenosis.  Tricuspid Valve: The tricuspid valve is normal in structure. Tricuspid valve regurgitation is mild . No evidence of tricuspid  stenosis.  Aortic Valve: The aortic valve is tricuspid. Aortic valve regurgitation is trivial. Aortic regurgitation PHT measures 695 msec. Aortic valve sclerosis/calcification is present, without any evidence of aortic stenosis.  Pulmonic Valve: The pulmonic valve was normal in structure. Pulmonic valve regurgitation is trivial. No evidence of pulmonic stenosis.  Aorta: Aortic dilatation noted. There is borderline dilatation of the ascending aorta, measuring 38 mm.  Venous: The inferior vena cava is normal in size with greater than 50% respiratory variability, suggesting right atrial pressure of 3 mmHg.  IAS/Shunts: No atrial level shunt detected by color flow Doppler.   LEFT VENTRICLE PLAX 2D LVIDd:         4.00 cm   Diastology LVIDs:         3.00 cm   LV e' medial:   9.57 cm/s LV PW:         0.70 cm   LV E/e' medial: 6.8 LV IVS:        1.00 cm LVOT diam:     2.00 cm LV SV:         45 LV SV Index:   22 LVOT Area:     3.14 cm   RIGHT VENTRICLE RV Basal diam:  3.10 cm RV S prime:     11.90 cm/s TAPSE (M-mode): 2.2 cm RVSP:           19.5 mmHg  LEFT ATRIUM             Index        RIGHT ATRIUM           Index LA diam:        3.40 cm 1.68 cm/m   RA Pressure: 3.00 mmHg LA Vol (A2C):   76.0 ml 37.66 ml/m  RA Area:     21.10 cm LA Vol (A4C):   44.3 ml 21.95  ml/m  RA Volume:   57.40 ml  28.44 ml/m LA Biplane Vol: 60.9 ml 30.18 ml/m AORTIC VALVE LVOT Vmax:   76.60 cm/s LVOT Vmean:  55.450 cm/s LVOT VTI:    0.142 m AI PHT:      695 msec  AORTA Ao Root diam: 3.50 cm Ao Asc diam:  3.80 cm  MITRAL VALVE               TRICUSPID VALVE MV Area (PHT):             TR Peak grad:   16.5 mmHg MV Decel Time:             TR Vmax:        203.00 cm/s MV E velocity: 65.50 cm/s  Estimated RAP:  3.00 mmHg RVSP:           19.5 mmHg  SHUNTS Systemic VTI:  0.14 m Systemic Diam: 2.00 cm  Redell Shallow MD Electronically signed by Redell Shallow MD Signature Date/Time: 11/10/2021/2:04:13 PM    Final    MONITORS  LONG TERM MONITOR (3-14 DAYS) 09/18/2021  Narrative Patch Wear Time:  3 days and 7 hours (2023-02-12T09:39:31-0500 to 2023-02-15T16:44:37-0500)  Atrial Fibrillation occurred continuously (100% burden), ranging from 35-176 bpm (avg of 78 bpm). 9 Pauses occurred, the longest lasting 3.4 secs (18 bpm). Isolated VEs were rare (<1.0%), VE Couplets were rare (<1.0%), and no VE Triplets were present.  Conclusion: Continuous atrial fibrillation, noted pauses appreciated however not significant in the setting of atrial fibrillation.       ______________________________________________________________________________________________      EKG:  Recent Labs: 04/20/2023: Magnesium 1.9 01/02/2024: ALT 24; TSH 2.56 01/23/2024: BUN 16; Creatinine, Ser 1.33; Hemoglobin 15.7; Platelets 222; Potassium 4.7; Sodium 142  Recent Lipid Panel    Component Value Date/Time   CHOL 183 01/02/2024 1229   TRIG 79.0 01/02/2024 1229   HDL 71.80 01/02/2024 1229   CHOLHDL 3 01/02/2024 1229   VLDL 15.8 01/02/2024 1229   LDLCALC 95 01/02/2024 1229   LDLCALC 59 04/20/2023 1431     Risk Assessment/Calculations:    CHA2DS2-VASc Score = 3   This indicates a 3.2% annual risk of stroke. The patient's score is based upon: CHF History: 0 HTN History:  1 Diabetes History: 0 Stroke History: 0 Vascular Disease History: 1 Age Score: 1 Gender Score: 0               Physical Exam:    VS:  BP 124/70   Pulse 70   Ht 5' 11 (1.803 m)   Wt 188 lb 12.8 oz (85.6 kg)   SpO2 97%   BMI 26.33 kg/m     Wt Readings from Last 3 Encounters:  03/28/24 188 lb 12.8 oz (85.6 kg)  03/02/24 188 lb 9.6 oz (85.5 kg)  02/15/24 180 lb (81.6 kg)     GEN:  Well nourished, well developed physically fit male in no acute distress HEENT: Normal NECK: No JVD; No carotid bruits LYMPHATICS: No lymphadenopathy CARDIAC: RRR, no murmurs, rubs, gallops RESPIRATORY:  Clear to auscultation without rales, wheezing or rhonchi  ABDOMEN: Soft, non-tender, non-distended MUSCULOSKELETAL:  No edema; No deformity  SKIN: Warm and dry NEUROLOGIC:  Alert and oriented x 3 PSYCHIATRIC:  Normal affect   Assessment & Plan Persistent atrial fibrillation (HCC) Patient's status post ablation.  Anticoagulated with apixaban .  Followed by EP.  Recommend an updated echocardiogram which will be ordered today. Essential hypertension Blood pressure is under excellent control on diltiazem . Hyperlipidemia, mixed Discussed treatment options with him.  Last LDL was 95.  He is not currently interested in injectable therapies.  Start ezetimibe  10 mg daily.  Continue rosuvastatin  every other day as tolerated.  Repeat lipids and LFTs in 3 months.            Medication Adjustments/Labs and Tests Ordered: Current medicines are reviewed at length with the patient today.  Concerns regarding medicines are outlined above.  Orders Placed This Encounter  Procedures   Hepatic function panel   Lipid Profile   ECHOCARDIOGRAM COMPLETE   Meds ordered this encounter  Medications   ezetimibe  (ZETIA ) 10 MG tablet    Sig: Take 1 tablet (10 mg total) by mouth daily.    Dispense:  90 tablet    Refill:  3    Patient Instructions  Medication Instructions:  Your physician has recommended  you make the following change in your medication:  Start Zetia  10mg  daily  Lab Work: Lipid and liver panel in 2 months  You may go to any [facility] Location for your lab work:  [facility] - [address] ([facility]) - [address] [phone] [address]  [place] - [address]   [place]  - [address]   [place] - [address] - [address] [employer]  - [address] - [address] ([facility]   If you have labs (blood work) drawn today and your tests are completely normal, you will  receive your results only by: MyChart Message (if you have MyChart)  If you have any lab test that is abnormal or we need to change your treatment, we will call you or send a MyChart message to review the results.  Testing/Procedures: None ordered.  Follow-Up: At [facility], you and your health needs are our priority.  As part of our continuing mission to provide you with exceptional heart care, we have created designated Provider Care Teams.  These Care Teams include your primary Cardiologist (physician) and Advanced Practice Providers (APPs -  Physician Assistants and Nurse Practitioners) who all work together to provide you with the care you need, when you need it.  Your next appointment:   1 year(s)  The format for your next appointment:   In Person  Provider:   Ozell Fell, MD         Signed, Ozell Fell, MD  03/28/2024 2:11 PM    [facility]

## 2024-03-28 NOTE — Assessment & Plan Note (Signed)
 Discussed treatment options with him.  Last LDL was 95.  He is not currently interested in injectable therapies.  Start ezetimibe  10 mg daily.  Continue rosuvastatin  every other day as tolerated.  Repeat lipids and LFTs in 3 months.

## 2024-03-28 NOTE — Assessment & Plan Note (Signed)
 Blood pressure is under excellent control on diltiazem .

## 2024-03-28 NOTE — Patient Instructions (Addendum)
 Medication Instructions:  Your physician has recommended you make the following change in your medication:  Start Zetia  10mg  daily  Lab Work: Lipid and liver panel in 2 months  You may go to any [facility] Location for your lab work:  [facility] - [address] ([facility]) - [address] [phone] [address]  [place] - [address]   [place]  - [address]   [place] - [address] - [address] [employer]  - [address] [place] - [address] ([facility]   If you have labs (blood work) drawn today and your tests are completely normal, you will receive your results only by: [employer] (if you have MyChart)  If you have any lab test that is abnormal or we need to change your treatment, we will call you or send a MyChart message to review the results.  Testing/Procedures: Your physician has requested that you have an echocardiogram. Echocardiography is a painless test that uses sound waves to create images of your heart. It provides your doctor with information about the size and shape of your heart and how well your heart's chambers and valves are working. This procedure takes approximately one hour. There are no restrictions for this procedure. Please do NOT wear cologne, perfume, aftershave, or lotions (deodorant is allowed). Please arrive 15 minutes prior to your appointment time.  Please note: We ask at that you not bring children with you during ultrasound (echo/ vascular) testing. Due to room size and safety concerns, children are not allowed in the ultrasound rooms during exams. Our front office staff cannot provide observation of children in our lobby area while testing is being conducted. An adult accompanying a patient to their appointment will only be allowed in the ultrasound room at the discretion of the ultrasound technician under special  circumstances. We apologize for any inconvenience.   Follow-Up: At [facility], you and your health needs are our priority.  As part of our continuing mission to provide you with exceptional heart care, we have created designated Provider Care Teams.  These Care Teams include your primary Cardiologist (physician) and Advanced Practice Providers (APPs -  Physician Assistants and Nurse Practitioners) who all work together to provide you with the care you need, when you need it.  Your next appointment:   1 year(s)  The format for your next appointment:   In Person  Provider:   Ozell Fell, MD

## 2024-04-05 ENCOUNTER — Encounter: Payer: Self-pay | Admitting: Internal Medicine

## 2024-04-09 ENCOUNTER — Other Ambulatory Visit: Payer: Self-pay | Admitting: Internal Medicine

## 2024-04-09 MED ORDER — TADALAFIL 20 MG PO TABS: 20.0000 mg | ORAL_TABLET | ORAL | 11 refills | Status: DC | PRN

## 2024-04-30 ENCOUNTER — Ambulatory Visit (HOSPITAL_COMMUNITY)
Admission: RE | Admit: 2024-04-30 | Discharge: 2024-04-30 | Disposition: A | Discharge status: Home / Self Care | Source: Ambulatory Visit | Attending: Cardiovascular Disease | Admitting: Cardiovascular Disease

## 2024-04-30 DIAGNOSIS — E782 Mixed hyperlipidemia: Secondary | ICD-10-CM | POA: Diagnosis present

## 2024-04-30 DIAGNOSIS — I10 Essential (primary) hypertension: Secondary | ICD-10-CM | POA: Diagnosis not present

## 2024-04-30 DIAGNOSIS — I4819 Other persistent atrial fibrillation: Secondary | ICD-10-CM | POA: Diagnosis present

## 2024-04-30 LAB — ECHOCARDIOGRAM COMPLETE
Area-P 1/2: 2.87 cm2
P 1/2 time: 1530 ms
S' Lateral: 3.3 cm

## 2024-05-01 ENCOUNTER — Ambulatory Visit: Payer: Self-pay | Admitting: Cardiovascular Disease

## 2024-05-04 ENCOUNTER — Encounter (HOSPITAL_COMMUNITY): Payer: Self-pay | Admitting: Internal Medicine

## 2024-05-04 ENCOUNTER — Ambulatory Visit (HOSPITAL_COMMUNITY)
Admission: RE | Admit: 2024-05-04 | Discharge: 2024-05-04 | Disposition: A | Discharge status: Home / Self Care | Source: Ambulatory Visit | Attending: Physician Assistant | Admitting: Physician Assistant

## 2024-05-04 VITALS — BP 132/78 | HR 62 | Ht 71.0 in | Wt 183.2 lb

## 2024-05-04 DIAGNOSIS — D6869 Other thrombophilia: Secondary | ICD-10-CM

## 2024-05-04 DIAGNOSIS — I4819 Other persistent atrial fibrillation: Secondary | ICD-10-CM | POA: Diagnosis not present

## 2024-05-04 DIAGNOSIS — I483 Typical atrial flutter: Secondary | ICD-10-CM | POA: Diagnosis not present

## 2024-05-04 NOTE — Progress Notes (Signed)
 Primary Care Physician: Garald Karlynn GAILS, MD Primary Cardiologist: Ozell Fell, MD Electrophysiologist: Will Gladis Norton, MD  Referring Physician: Dr Norton Garnette KATHEE Janet is a 68 y.o. male with a history of HLD, HTN, CAD, atrial flutter, atrial fibrillation who presents for follow up in the [facility]. The patient was initially diagnosed with atrial fibrillation 07/2021. He had DCCV 09/2021 which was unsuccessful. He was seen by Dr Norton and underwent afib ablation on 02/23/22. Patient is on Eliquis  for stroke prevention. He was later found to have typical atrial flutter. He underwent DCCV on 09/22/23 and then repeat afib and flutter ablation with Dr Norton on 02/02/24.  On follow up 05/04/24, patient is here for 90 day ablation visit. He has noted overall no Afib burden since last visit. He wears an Apple watch to monitor rhythm. No missed doses of Eliquis .   Today, he  denies symptoms of palpitations, chest pain, shortness of breath, orthopnea, PND, lower extremity edema, dizziness, presyncope, syncope, snoring, daytime somnolence, bleeding, or neurologic sequela. The patient is tolerating medications without difficulties and is otherwise without complaint today.    Atrial Fibrillation Risk Factors:  he does not have symptoms or diagnosis of sleep apnea. Negative sleep study 2023. he does not have a history of rheumatic fever.   Atrial Fibrillation Management history:  Previous antiarrhythmic drugs: none Previous cardioversions: 09/2021, 09/22/23 Previous ablations: 02/23/22, 02/02/24 Anticoagulation history: Eliquis   ROS- All systems are reviewed and negative except as per the HPI above.  Past Medical History:  Diagnosis Date   Family history of premature CAD    Hyperlipidemia    Hypertension    PAF (paroxysmal atrial fibrillation) (HCC)    Prediabetes     Current Outpatient Medications  Medication Sig Dispense Refill   apixaban   (ELIQUIS ) 5 MG TABS tablet Take  1 tablet  2 x /day (every 12 hours) to prevent Blood Clots                                       /                         TAKE      BYMOUTH 180 tablet 3   bisoprolol  (ZEBETA ) 5 MG tablet TAKE 1 TABLET BY MOUTH DAILY FOR BLOOD PRESSURE AND HEART RATE/RHYTHM 90 tablet 3   Cholecalciferol (VITAMIN D3) 250 MCG (10000 UT) capsule Take 10,000 Units by mouth in the morning.     diltiazem  (CARDIZEM ) 30 MG tablet Cardizem  30mg  -- Take 1 tablet every 4 hours AS NEEDED for AFIB heart rate >100 as long as top BP >100. 30 tablet 1   diltiazem  (TIAZAC ) 180 MG 24 hr capsule TAKE 1 CAPSULE BY MOUTH DAILY FOR BLOOD PRESSURE AND HEART RHYTHM 90 capsule 3   ezetimibe  (ZETIA ) 10 MG tablet Take 1 tablet (10 mg total) by mouth daily. 90 tablet 3   Magnesium Oxide (MAG-200 PO) Take 400 mg by mouth in the morning.     Menaquinone-7 (VITAMIN K2 PO) Take 1 tablet by mouth in the morning.     rosuvastatin  (CRESTOR ) 20 MG tablet Take 1 tablet Daily for Cholesterol (Patient taking differently: Take 10 mg by mouth daily. Take 1 tablet Daily for Cholesterol) 90 tablet 3   tadalafil  (CIALIS ) 20 MG tablet Take 1 tablet (20 mg total)  by mouth every three (3) days as needed for erectile dysfunction. 10 tablet 11   No current facility-administered medications for this encounter.    Physical Exam: BP 132/78   Pulse 62   Ht 5' 11 (1.803 m)   Wt 83.1 kg   BMI 25.55 kg/m   GEN- The patient is well appearing, alert and oriented x 3 today.   Neck - no JVD or carotid bruit noted Lungs- Clear to ausculation bilaterally, normal work of breathing Heart- Regular rate and rhythm, no murmurs, rubs or gallops, PMI not laterally displaced Extremities- no clubbing, cyanosis, or edema Skin - no rash or ecchymosis noted   Wt Readings from Last 3 Encounters:  05/04/24 83.1 kg  03/28/24 85.6 kg  03/02/24 85.5 kg     ECG today demonstrates Vent. rate 62 BPM PR interval 198 ms QRS duration 102  ms QT/QTcB 388/393 ms P-R-T axes 74 -62 -32 Normal sinus rhythm Incomplete right bundle branch block Left anterior fascicular block Nonspecific ST and T wave abnormality Abnormal ECG When compared with ECG of 02-Mar-2024 08:25, Previous ECG is present   Echo 04/30/24:  1. Left ventricular ejection fraction, by estimation, is 55 to 60%. Left  ventricular ejection fraction by 3D volume is 61 %. The left ventricle has  normal function. The left ventricle has no regional wall motion  abnormalities. Left ventricular diastolic   parameters are consistent with Grade II diastolic dysfunction  (pseudonormalization).   2. Right ventricular systolic function is normal. The right ventricular  size is normal. There is normal pulmonary artery systolic pressure. The  estimated right ventricular systolic pressure is 34.0 mmHg.   3. Right atrial size was mildly dilated.   4. The mitral valve is normal in structure. No evidence of mitral valve  regurgitation. No evidence of mitral stenosis.   5. The aortic valve is tricuspid. There is mild calcification of the  aortic valve. Aortic valve regurgitation is trivial. No aortic stenosis is  present.   6. The inferior vena cava is dilated in size with <50% respiratory  variability, suggesting right atrial pressure of 15 mmHg.   CHA2DS2-VASc Score = 3  The patient's score is based upon: CHF History: 0 HTN History: 1 Diabetes History: 0 Stroke History: 0 Vascular Disease History: 1 Age Score: 1 Gender Score: 0       ASSESSMENT AND PLAN: Persistent Atrial Fibrillation/typical atrial flutter (ICD10:  I48.19) The patient's CHA2DS2-VASc score is 3, indicating a 3.2% annual risk of stroke.   S/p afib ablation 02/23/22, repeat afib and flutter ablation 02/02/24 with Dr. Inocencio.  Patient is currently in NSR. Continue diltiazem  180 mg daily. Continue bisoprolol  5 mg daily.  Secondary Hypercoagulable State (ICD10:  D68.69) The patient is at significant  risk for stroke/thromboembolism based upon his CHA2DS2-VASc Score of 3.  Continue Apixaban  (Eliquis ). Continue Eliquis .   HTN Stable today  CAD CAC score 902 No anginal symptoms.  Followed by Dr Wonda   Follow up with Dr. Inocencio in 6 months.     Dorn Heinrich, [facility] [address] [place], [place] [address] [phone]

## 2024-07-02 ENCOUNTER — Ambulatory Visit: Admitting: Internal Medicine

## 2024-07-02 ENCOUNTER — Encounter: Payer: Self-pay | Admitting: Internal Medicine

## 2024-07-02 VITALS — BP 124/58 | HR 66 | Ht 71.0 in | Wt 190.8 lb

## 2024-07-02 DIAGNOSIS — Z0000 Encounter for general adult medical examination without abnormal findings: Secondary | ICD-10-CM

## 2024-07-02 DIAGNOSIS — N401 Enlarged prostate with lower urinary tract symptoms: Secondary | ICD-10-CM

## 2024-07-02 DIAGNOSIS — E559 Vitamin D deficiency, unspecified: Secondary | ICD-10-CM

## 2024-07-02 DIAGNOSIS — I10 Essential (primary) hypertension: Secondary | ICD-10-CM

## 2024-07-02 DIAGNOSIS — I480 Paroxysmal atrial fibrillation: Secondary | ICD-10-CM

## 2024-07-02 DIAGNOSIS — R7303 Prediabetes: Secondary | ICD-10-CM

## 2024-07-02 DIAGNOSIS — I2583 Coronary atherosclerosis due to lipid rich plaque: Secondary | ICD-10-CM

## 2024-07-02 MED ORDER — TADALAFIL 20 MG PO TABS: 20.0000 mg | ORAL_TABLET | ORAL | 3 refills | Status: DC | PRN

## 2024-07-02 NOTE — Assessment & Plan Note (Signed)
 S/p ablation 02/02/2024

## 2024-07-02 NOTE — Assessment & Plan Note (Signed)
 BP Readings from Last 3 Encounters:  07/02/24 (!) 124/58  05/04/24 132/78  03/28/24 124/70  Normal blood pressure

## 2024-07-02 NOTE — Assessment & Plan Note (Signed)
 Cialis  5 mg/d - d/c

## 2024-07-02 NOTE — Assessment & Plan Note (Signed)
Monitor CBG

## 2024-07-02 NOTE — Progress Notes (Signed)
 Subjective:  Patient ID: Frank Hunt, male    DOB: [birth year]  Age: 68 y.o. MRN: [id number]  CC: Medical Management of Chronic Issues (6 Month follow up)   HPI Frank Hunt presents for A fib, ED, dyslipidema  Outpatient Medications Prior to Visit  Medication Sig Dispense Refill   apixaban  (ELIQUIS ) 5 MG TABS tablet Take  1 tablet  2 x /day (every 12 hours) to prevent Blood Clots                                       /                         TAKE      BYMOUTH 180 tablet 3   bisoprolol  (ZEBETA ) 5 MG tablet TAKE 1 TABLET BY MOUTH DAILY FOR BLOOD PRESSURE AND HEART RATE/RHYTHM 90 tablet 3   Cholecalciferol (VITAMIN D3) 250 MCG (10000 UT) capsule Take 10,000 Units by mouth in the morning.     diltiazem  (CARDIZEM ) 30 MG tablet Cardizem  30mg  -- Take 1 tablet every 4 hours AS NEEDED for AFIB heart rate >100 as long as top BP >100. 30 tablet 1   diltiazem  (TIAZAC ) 180 MG 24 hr capsule TAKE 1 CAPSULE BY MOUTH DAILY FOR BLOOD PRESSURE AND HEART RHYTHM 90 capsule 3   ezetimibe  (ZETIA ) 10 MG tablet Take 1 tablet (10 mg total) by mouth daily. 90 tablet 3   Magnesium Oxide (MAG-200 PO) Take 400 mg by mouth in the morning.     Menaquinone-7 (VITAMIN K2 PO) Take 1 tablet by mouth in the morning.     rosuvastatin  (CRESTOR ) 20 MG tablet Take 1 tablet Daily for Cholesterol (Patient taking differently: Take 10 mg by mouth daily. Take 1 tablet Daily for Cholesterol) 90 tablet 3   tadalafil  (CIALIS ) 20 MG tablet Take 1 tablet (20 mg total) by mouth every three (3) days as needed for erectile dysfunction. 10 tablet 11   No facility-administered medications prior to visit.    ROS: Review of Systems  Constitutional:  Negative for appetite change, fatigue and unexpected weight change.  HENT:  Negative for congestion, nosebleeds, sneezing, sore throat and trouble swallowing.   Eyes:  Negative for itching and visual disturbance.  Respiratory:  Negative for cough.   Cardiovascular:  Negative for chest  pain, palpitations and leg swelling.  Gastrointestinal:  Negative for abdominal distention, blood in stool, diarrhea and nausea.  Genitourinary:  Negative for frequency and hematuria.  Musculoskeletal:  Negative for back pain, gait problem, joint swelling and neck pain.  Skin:  Negative for rash.  Neurological:  Negative for dizziness, tremors, speech difficulty and weakness.  Psychiatric/Behavioral:  Negative for agitation, dysphoric mood, sleep disturbance and suicidal ideas. The patient is not nervous/anxious.     Objective:  BP (!) 124/58   Pulse 66   Ht 5' 11 (1.803 m)   Wt 190 lb 12.8 oz (86.5 kg)   SpO2 97%   BMI 26.61 kg/m   BP Readings from Last 3 Encounters:  07/02/24 (!) 124/58  05/04/24 132/78  03/28/24 124/70    Wt Readings from Last 3 Encounters:  07/02/24 190 lb 12.8 oz (86.5 kg)  05/04/24 183 lb 3.2 oz (83.1 kg)  03/28/24 188 lb 12.8 oz (85.6 kg)    Physical Exam Constitutional:      General: He is not  in acute distress.    Appearance: He is well-developed.     Comments: NAD  Eyes:     Conjunctiva/sclera: Conjunctivae normal.     Pupils: Pupils are equal, round, and reactive to light.  Neck:     Thyroid : No thyromegaly.     Vascular: No JVD.  Cardiovascular:     Rate and Rhythm: Normal rate and regular rhythm.     Heart sounds: Normal heart sounds. No murmur heard.    No friction rub. No gallop.  Pulmonary:     Effort: Pulmonary effort is normal. No respiratory distress.     Breath sounds: Normal breath sounds. No wheezing or rales.  Chest:     Chest wall: No tenderness.  Abdominal:     General: Bowel sounds are normal. There is no distension.     Palpations: Abdomen is soft. There is no mass.     Tenderness: There is no abdominal tenderness. There is no guarding or rebound.  Musculoskeletal:        General: No tenderness. Normal range of motion.     Cervical back: Normal range of motion.  Lymphadenopathy:     Cervical: No cervical  adenopathy.  Skin:    General: Skin is warm and dry.     Findings: No rash.  Neurological:     Mental Status: He is alert and oriented to person, place, and time.     Cranial Nerves: No cranial nerve deficit.     Motor: No abnormal muscle tone.     Coordination: Coordination normal.     Gait: Gait normal.     Deep Tendon Reflexes: Reflexes are normal and symmetric.  Psychiatric:        Behavior: Behavior normal.        Thought Content: Thought content normal.        Judgment: Judgment normal.     Lab Results  Component Value Date   WBC 7.0 01/23/2024   HGB 15.7 01/23/2024   HCT 50.1 01/23/2024   PLT 222 01/23/2024   GLUCOSE 86 01/23/2024   CHOL 183 01/02/2024   TRIG 79.0 01/02/2024   HDL 71.80 01/02/2024   LDLCALC 95 01/02/2024   ALT 24 01/02/2024   AST 26 01/02/2024   NA 142 01/23/2024   K 4.7 01/23/2024   CL 103 01/23/2024   CREATININE 1.33 (H) 01/23/2024   BUN 16 01/23/2024   CO2 20 01/23/2024   TSH 2.56 01/02/2024   PSA 1.17 01/02/2024   INR 1.1 09/11/2021   HGBA1C 5.8 01/02/2024   MICROALBUR 0.2 04/20/2023    No results found.  Assessment & Plan:   Problem List Items Addressed This Visit     BPH (benign prostatic hyperplasia) - Primary    Cialis  5 mg/d - d/c      Coronary atherosclerosis   On Crestor  20 mg 1/2 qod      Relevant Medications   tadalafil  (CIALIS ) 20 MG tablet   Essential hypertension   BP Readings from Last 3 Encounters:  07/02/24 (!) 124/58  05/04/24 132/78  03/28/24 124/70  Normal blood pressure       Relevant Medications   tadalafil  (CIALIS ) 20 MG tablet   PAF (paroxysmal atrial fibrillation) (HCC)    S/p ablation 02/02/2024      Relevant Medications   tadalafil  (CIALIS ) 20 MG tablet   Other Relevant Orders   TSH   Urinalysis   CBC with Differential/Platelet   Lipid panel   PSA   Comprehensive  metabolic panel with GFR   Prediabetes   Monitor CBG      Vitamin D  deficiency   On Vit D      Other Visit  Diagnoses       Well adult exam       Relevant Orders   TSH   Urinalysis   CBC with Differential/Platelet   Lipid panel   PSA   Comprehensive metabolic panel with GFR         Meds ordered this encounter  Medications   tadalafil  (CIALIS ) 20 MG tablet    Sig: Take 1 tablet (20 mg total) by mouth every three (3) days as needed for erectile dysfunction.    Dispense:  30 tablet    Refill:  3      Follow-up: Return in about 6 months (around 12/31/2024) for Wellness Exam.  Marolyn Noel, MD

## 2024-07-02 NOTE — Patient Instructions (Signed)

## 2024-07-02 NOTE — Assessment & Plan Note (Signed)
 On Vit D

## 2024-07-02 NOTE — Assessment & Plan Note (Signed)
 On Crestor  20 mg 1/2 qod

## 2024-08-21 ENCOUNTER — Encounter: Payer: Self-pay | Admitting: Internal Medicine

## 2024-08-22 ENCOUNTER — Other Ambulatory Visit: Payer: Self-pay | Admitting: Internal Medicine

## 2024-08-22 DIAGNOSIS — I480 Paroxysmal atrial fibrillation: Secondary | ICD-10-CM

## 2024-08-22 MED ORDER — APIXABAN 5 MG PO TABS: ORAL_TABLET | ORAL | 3 refills | Status: AC

## 2024-08-22 MED ORDER — TADALAFIL 20 MG PO TABS: 20.0000 mg | ORAL_TABLET | ORAL | 3 refills | Status: AC | PRN

## 2025-02-18 ENCOUNTER — Ambulatory Visit
